# Patient Record
Sex: Female | Born: 1990 | Race: White | Hispanic: No | Marital: Married | Smoking: Never smoker
Health system: Southern US, Community
[De-identification: ages and names within clinical notes are randomized; demographics above are authoritative.]

## PROBLEM LIST (undated history)

## (undated) ENCOUNTER — Inpatient Hospital Stay: Payer: Self-pay

## (undated) DIAGNOSIS — F419 Anxiety disorder, unspecified: Secondary | ICD-10-CM

## (undated) DIAGNOSIS — K769 Liver disease, unspecified: Secondary | ICD-10-CM

## (undated) DIAGNOSIS — F53 Postpartum depression: Secondary | ICD-10-CM

## (undated) DIAGNOSIS — F429 Obsessive-compulsive disorder, unspecified: Secondary | ICD-10-CM

## (undated) DIAGNOSIS — L509 Urticaria, unspecified: Secondary | ICD-10-CM

## (undated) DIAGNOSIS — E559 Vitamin D deficiency, unspecified: Secondary | ICD-10-CM

## (undated) DIAGNOSIS — O24419 Gestational diabetes mellitus in pregnancy, unspecified control: Secondary | ICD-10-CM

## (undated) HISTORY — PX: TONSILLECTOMY AND ADENOIDECTOMY: SHX28

## (undated) HISTORY — PX: ADENOIDECTOMY: SUR15

## (undated) HISTORY — PX: TONSILLECTOMY: SUR1361

## (undated) HISTORY — DX: Vitamin D deficiency, unspecified: E55.9

## (undated) HISTORY — DX: Liver disease, unspecified: K76.9

## (undated) HISTORY — DX: Gestational diabetes mellitus in pregnancy, unspecified control: O24.419

## (undated) HISTORY — DX: Urticaria, unspecified: L50.9

---

## 2013-01-24 ENCOUNTER — Encounter (HOSPITAL_COMMUNITY): Payer: Self-pay | Admitting: Emergency Medicine

## 2013-01-24 ENCOUNTER — Emergency Department (HOSPITAL_COMMUNITY)
Admission: EM | Admit: 2013-01-24 | Discharge: 2013-01-24 | Disposition: A | Discharge status: Home / Self Care | Payer: 59 | Source: Home / Self Care

## 2013-01-24 ENCOUNTER — Emergency Department (INDEPENDENT_AMBULATORY_CARE_PROVIDER_SITE_OTHER): Payer: 59

## 2013-01-24 DIAGNOSIS — K5900 Constipation, unspecified: Secondary | ICD-10-CM

## 2013-01-24 DIAGNOSIS — R141 Gas pain: Secondary | ICD-10-CM

## 2013-01-24 LAB — POCT URINALYSIS DIP (DEVICE)
Hgb urine dipstick: NEGATIVE
Ketones, ur: NEGATIVE mg/dL
Leukocytes, UA: NEGATIVE
Protein, ur: NEGATIVE mg/dL
Specific Gravity, Urine: 1.015 (ref 1.005–1.030)
Urobilinogen, UA: 0.2 mg/dL (ref 0.0–1.0)
pH: 8 (ref 5.0–8.0)

## 2013-01-24 NOTE — ED Provider Notes (Signed)
Medical screening examination/treatment/procedure(s) were performed by non-physician practitioner and as supervising physician I was immediately available for consultation/collaboration.  Leslee Home, M.D.  Reuben Likes, MD 01/24/13 2258

## 2013-01-24 NOTE — ED Provider Notes (Signed)
CSN: [id number]     Arrival date & time 01/24/13  1704 History   First MD Initiated Contact with Patient 01/24/13 1728     Chief Complaint  Patient presents with  . Abdominal Pain    5-7 days. worse with eating.    (Consider location/radiation/quality/duration/timing/severity/associated sxs/prior Treatment) HPI Comments: 22 year old mildly obese female complaining of pain across the upper abdomen this started approximately 5-7 days ago. Today her pain is primarily in the upper right quadrant. It is intermittent but more present than not. It is worse after meals and noting that the pain is worse in the right upper quadrant after meals. She denies having a regular bowel movement on any particular day but did have 3 small bowel movements yesterday. She has nausea and took Pepto-Bismol for that. Denies vomiting, diarrhea, fever or urinary symptoms. Denies headache, upper is for congestion, sore throat, earache, chest pain or shortness of breath.   History reviewed. No pertinent past medical history. History reviewed. No pertinent past surgical history. History reviewed. No pertinent family history. History  Substance Use Topics  . Smoking status: Never Smoker   . Smokeless tobacco: Not on file  . Alcohol Use: Yes   OB History   Grav Para Term Preterm Abortions TAB SAB Ect Mult Living                 Review of Systems  Constitutional: Positive for appetite change. Negative for fever, diaphoresis, activity change and fatigue.  HENT: Negative.   Respiratory: Negative.   Gastrointestinal: Positive for abdominal pain. Negative for diarrhea, blood in stool, abdominal distention and rectal pain.  Genitourinary: Negative.   Musculoskeletal: Negative.   Skin: Negative.   Neurological: Negative.     Allergies  Review of patient's allergies indicates no known allergies.  Home Medications  No current outpatient prescriptions on file. BP 97/62  Pulse 67  Temp(Src) 99 F (37.2 C) (Oral)   Resp 20  SpO2 99%  LMP 12/29/2012 Physical Exam  Nursing note and vitals reviewed. Constitutional: She is oriented to person, place, and time. She appears well-developed and well-nourished. No distress.  HENT:  Mouth/Throat: Oropharynx is clear and moist.  Eyes: Conjunctivae and EOM are normal.  Neck: Neck supple.  Cardiovascular: Normal rate, regular rhythm, normal heart sounds and intact distal pulses.   No murmur heard. Pulmonary/Chest: Effort normal and breath sounds normal. No respiratory distress. She has no wheezes. She has no rales.  Abdominal: Soft. Bowel sounds are normal. She exhibits no distension and no mass. There is no rebound and no guarding.  Mild tenderness across the upper most abdomen and primarily in the right upper quadrant. There is no tenderness below the umbilicus. No tenderness over the suprapubic or pelvic areas.  Musculoskeletal: Normal range of motion. She exhibits no edema and no tenderness.  Lymphadenopathy:    She has no cervical adenopathy.  Neurological: She is alert and oriented to person, place, and time. No cranial nerve deficit. She exhibits normal muscle tone.  Skin: Skin is warm and dry. No erythema.    ED Course  Procedures (including critical care time) Labs Review Labs Reviewed  POCT URINALYSIS DIP (DEVICE)  POCT PREGNANCY, URINE   Imaging Review Dg Abd 1 View  01/24/2013   *RADIOLOGY REPORT*  Clinical Data: Abdominal pain.  ABDOMEN - 1 VIEW  Comparison: None  Findings: Nonobstructive bowel gas pattern.  Stool is present in the cecum and ascending colon with some mottled lucencies projecting over the descending colon.  Visualized skeleton is unremarkable.  IMPRESSION: Stool predominately within the cecum and ascending colon. Nonobstructive bowel gas pattern.   Original Report Authenticated By: Annia Belt, M.D   Results for orders placed during the hospital encounter of 01/24/13  POCT URINALYSIS DIP (DEVICE)      Result Value Range    Glucose, UA NEGATIVE  NEGATIVE mg/dL   Bilirubin Urine NEGATIVE  NEGATIVE   Ketones, ur NEGATIVE  NEGATIVE mg/dL   Specific Gravity, Urine 1.015  1.005 - 1.030   Hgb urine dipstick NEGATIVE  NEGATIVE   pH 8.0  5.0 - 8.0   Protein, ur NEGATIVE  NEGATIVE mg/dL   Urobilinogen, UA 0.2  0.0 - 1.0 mg/dL   Nitrite NEGATIVE  NEGATIVE   Leukocytes, UA NEGATIVE  NEGATIVE  POCT PREGNANCY, URINE      Result Value Range   Preg Test, Ur NEGATIVE  NEGATIVE    MDM   1. Abdominal gas pain   2. Constipation      May use MiraLax adult dosage as needed to clear out bowel. Recommend adding a high fiber diet. If getting any worse pain is more problematic, constant or has an increased intensity, inability to hold down fluids or solid foods or other problems 6 medical attention promptly. Abdominal exam is essentially benign. No peritoneal signs.  Hayden Rasmussen, NP 01/24/13 [phone]

## 2013-01-24 NOTE — ED Notes (Signed)
C/o epigastric and RUQ pain x 5-7 days. States pain is worse with eating. Nausea denies vomiting. States  Bloating and sharp pain radiates across upper abdomen. Pt has tried pepto and tums with no relief.

## 2013-02-01 ENCOUNTER — Telehealth (HOSPITAL_COMMUNITY): Payer: Self-pay | Admitting: *Deleted

## 2013-02-01 NOTE — ED Notes (Signed)
Pt. called and asked if we did a urine pregnancy test while she was here on 9/30.  Pt. verified x 2 and given negative result.  She said thank God. Vassie Moselle 02/01/2013

## 2014-01-03 ENCOUNTER — Emergency Department: Payer: Self-pay | Admitting: Emergency Medicine

## 2014-07-16 ENCOUNTER — Other Ambulatory Visit: Payer: Self-pay | Admitting: Family

## 2014-07-16 DIAGNOSIS — R1011 Right upper quadrant pain: Secondary | ICD-10-CM

## 2014-07-18 ENCOUNTER — Other Ambulatory Visit: Payer: Self-pay | Admitting: Family

## 2014-07-18 ENCOUNTER — Ambulatory Visit
Admission: RE | Admit: 2014-07-18 | Discharge: 2014-07-18 | Disposition: A | Discharge status: Home / Self Care | Payer: 59 | Source: Ambulatory Visit | Attending: Family | Admitting: Family

## 2014-07-18 ENCOUNTER — Ambulatory Visit: Admission: RE | Admit: 2014-07-18 | Payer: 59 | Source: Ambulatory Visit

## 2014-07-18 DIAGNOSIS — K5900 Constipation, unspecified: Secondary | ICD-10-CM

## 2014-07-18 DIAGNOSIS — R1011 Right upper quadrant pain: Secondary | ICD-10-CM

## 2014-07-20 ENCOUNTER — Other Ambulatory Visit: Payer: Self-pay

## 2016-02-11 IMAGING — CR DG ABDOMEN 1V
1 series · 1 of 1 positions shown · non-contrast
Comparison: None.

CLINICAL DATA: Five-day history of right flank pain with pain in
the pelvis and hematuria; previous episodes of constipation ; assess
stool burden

EXAM:
ABDOMEN - 1 VIEW

[view not recorded]
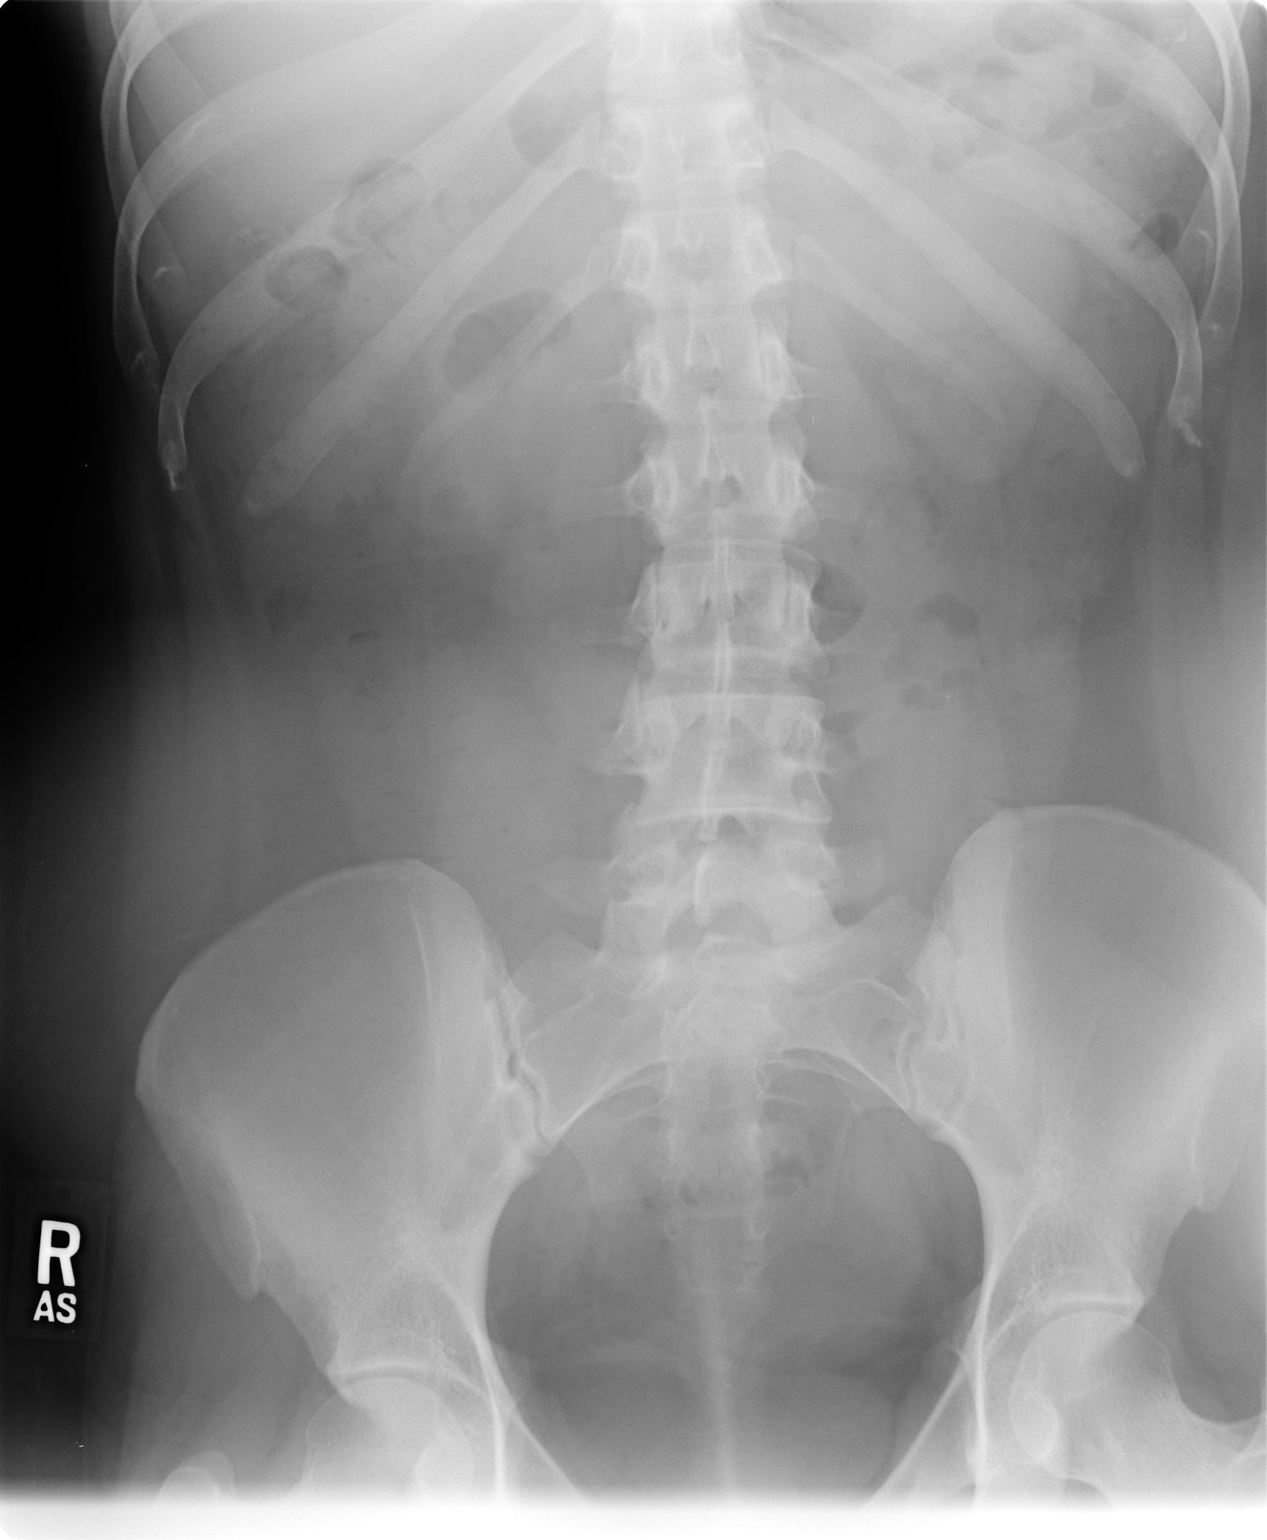

[1 of 1 positions shown; findings below may reference images not displayed]

FINDINGS: The stool burden within the colon is within the limits of normal.
There is no small or large bowel obstruction. There are no abnormal
soft tissue calcifications. The bony structures are unremarkable.
IMPRESSION: The stool burden does not appear excessive. No definite calcified
urinary tract stones are demonstrated.

## 2016-04-24 ENCOUNTER — Ambulatory Visit (INDEPENDENT_AMBULATORY_CARE_PROVIDER_SITE_OTHER): Payer: 59 | Admitting: Certified Nurse Midwife

## 2016-04-24 VITALS — BP 105/60 | HR 91 | Ht 60.0 in | Wt 144.3 lb

## 2016-04-24 DIAGNOSIS — Z202 Contact with and (suspected) exposure to infections with a predominantly sexual mode of transmission: Secondary | ICD-10-CM

## 2016-04-24 DIAGNOSIS — Z3481 Encounter for supervision of other normal pregnancy, first trimester: Secondary | ICD-10-CM

## 2016-04-24 MED ORDER — CITRANATAL B-CALM 20-1 MG & 2 X 25 MG PO MISC: 25.0000 mg | Freq: Every day | ORAL | 3 refills | Status: DC

## 2016-04-24 NOTE — Patient Instructions (Signed)
Minor Illnesses and Medications in Pregnancy  Cold/Flu:  Sudafed for congestion- Robitussin (plain) for cough- Tylenol for discomfort.  Please follow the directions on the label.  Try not to take any more than needed.  OTC Saline nasal spray and air humidifier or cool-mist  Vaporizer to sooth nasal irritation and to loosen congestion.  It is also important to increase intake of non carbonated fluids, especially if you have a fever.  Constipation:  Colace-2 capsules at bedtime; Metamucil- follow directions on label; Senokot- 1 tablet at bedtime.  Any one of these medications can be used.  It is also very important to increase fluids and fruits along with regular exercise.  If problem persists please call the office.  Diarrhea:  Kaopectate as directed on the label.  Eat a bland diet and increase fluids.  Avoid highly seasoned foods.  Headache:  Tylenol 1 or 2 tablets every 3-4 hours as needed  Indigestion:  Maalox, Mylanta, Tums or Rolaids- as directed on label.  Also try to eat small meals and avoid fatty, greasy or spicy foods.  Nausea with or without Vomiting:  Nausea in pregnancy is caused by increased levels of hormones in the body which influence the digestive system and cause irritation when stomach acids accumulate.  Symptoms usually subside after 1st trimester of pregnancy.  Try the following: 1. Keep saltines, graham crackers or dry toast by your bed to eat upon awakening. 2. Don't let your stomach get empty.  Try to eat 5-6 small meals per day instead of 3 large ones. 3. Avoid greasy fatty or highly seasoned foods.  4. Take OTC Unisom 1 tablet at bed time along with OTC Vitamin B6 25-50 mg 3 times per day.    If nausea continues with vomiting and you are unable to keep down food and fluids you may need a prescription medication.  Please notify your provider.   Sore throat:  Chloraseptic spray, throat lozenges and or plain Tylenol.  Vaginal Yeast Infection:  OTC Monistat for 7 days as  directed on label.  If symptoms do not resolve within a week notify provider.  If any of the above problems do not subside with recommended treatment please call the office for further assistance.   Do not take Aspirin, Advil, Motrin or Ibuprofen.  * * OTC= Over the counter How a Baby Grows During Pregnancy Introduction Pregnancy begins when a female's sperm enters a female's egg (fertilization). This happens in one of the tubes (fallopian tubes) that connect the ovaries to the womb (uterus). The fertilized egg is called an embryo until it reaches 10 weeks. From 10 weeks until birth, it is called a fetus. The fertilized egg moves down the fallopian tube to the uterus. Then it implants into the lining of the uterus and begins to grow. The developing fetus receives oxygen and nutrients through the pregnant woman's bloodstream and the tissues that grow (placenta) to support the fetus. The placenta is the life support system for the fetus. It provides nutrition and removes waste. Learning as much as you can about your pregnancy and how your baby is developing can help you enjoy the experience. It can also make you aware of when there might be a problem and when to ask questions. How long does a typical pregnancy last? A pregnancy usually lasts 280 days, or about 40 weeks. Pregnancy is divided into three trimesters:  First trimester: 0-13 weeks.  Second trimester: 14-27 weeks.  Third trimester: 28-40 weeks. The day when your baby  is considered ready to be born (full term) is your estimated date of delivery. How does my baby develop month by month? First month  The fertilized egg attaches to the inside of the uterus.  Some cells will form the placenta. Others will form the fetus.  The arms, legs, brain, spinal cord, lungs, and heart begin to develop.  At the end of the first month, the heart begins to beat. Second month  The bones, inner ear, eyelids, hands, and feet form.  The genitals  develop.  By the end of 8 weeks, all major organs are developing. Third month  All of the internal organs are forming.  Teeth develop below the gums.  Bones and muscles begin to grow. The spine can flex.  The skin is transparent.  Fingernails and toenails begin to form.  Arms and legs continue to grow longer, and hands and feet develop.  The fetus is about 3 in (7.6 cm) long. Fourth month  The placenta is completely formed.  The external sex organs, neck, outer ear, eyebrows, eyelids, and fingernails are formed.  The fetus can hear, swallow, and move its arms and legs.  The kidneys begin to produce urine.  The skin is covered with a white waxy coating (vernix) and very fine hair (lanugo). Fifth month  The fetus moves around more and can be felt for the first time (quickening).  The fetus starts to sleep and wake up and may begin to suck its finger.  The nails grow to the end of the fingers.  The organ in the digestive system that makes bile (gallbladder) functions and helps to digest the nutrients.  If your baby is a girl, eggs are present in her ovaries. If your baby is a boy, testicles start to move down into his scrotum. Sixth month  The lungs are formed, but the fetus is not yet able to breathe.  The eyes open. The brain continues to develop.  Your baby has fingerprints and toe prints. Your baby's hair grows thicker.  At the end of the second trimester, the fetus is about 9 in (22.9 cm) long. Seventh month  The fetus kicks and stretches.  The eyes are developed enough to sense changes in light.  The hands can make a grasping motion.  The fetus responds to sound. Eighth month  All organs and body systems are fully developed and functioning.  Bones harden and taste buds develop. The fetus may hiccup.  Certain areas of the brain are still developing. The skull remains soft. Ninth month  The fetus gains about  lb (0.23 kg) each week.  The lungs  are fully developed.  Patterns of sleep develop.  The fetus's head typically moves into a head-down position (vertex) in the uterus to prepare for birth. If the buttocks move into a vertex position instead, the baby is breech.  The fetus weighs 6-9 lbs (2.72-4.08 kg) and is 19-20 in (48.26-50.8 cm) long. What can I do to have a healthy pregnancy and help my baby develop?  Eating and Drinking  Eat a healthy diet.  Talk with your health care provider to make sure that you are getting the nutrients that you and your baby need.  Visit www.BuildDNA.es to learn about creating a healthy diet.  Gain a healthy amount of weight during pregnancy as advised by your health care provider. This is usually 25-35 pounds. You may need to:  Gain more if you were underweight before getting pregnant or if you are pregnant  with more than one baby.  Gain less if you were overweight or obese when you got pregnant. Medicines and Vitamins  Take prenatal vitamins as directed by your health care provider. These include vitamins such as folic acid, iron, calcium, and vitamin D. They are important for healthy development.  Take medicines only as directed by your health care provider. Read labels and ask a pharmacist or your health care provider whether over-the-counter medicines, supplements, and prescription drugs are safe to take during pregnancy. Activities  Be physically active as advised by your health care provider. Ask your health care provider to recommend activities that are safe for you to do, such as walking or swimming.  Do not participate in strenuous or extreme sports.  Lifestyle  Do not drink alcohol.  Do not use any tobacco products, including cigarettes, chewing tobacco, or electronic cigarettes. If you need help quitting, ask your health care provider.  Do not use illegal drugs. Safety  Avoid exposure to mercury, lead, or other heavy metals. Ask your health care provider about  common sources of these heavy metals.  Avoid listeria infection during pregnancy. Follow these precautions:  Do not eat soft cheeses or deli meats.  Do not eat hot dogs unless they have been warmed up to the point of steaming, such as in the microwave oven.  Do not drink unpasteurized milk.  Avoid toxoplasmosis infection during pregnancy. Follow these precautions:  Do not change your cat's litter box, if you have a cat. Ask someone else to do this for you.  Wear gardening gloves while working in the yard. General Instructions  Keep all follow-up visits as directed by your health care provider. This is important. This includes prenatal care and screening tests.  Manage any chronic health conditions. Work closely with your health care provider to keep conditions, such as diabetes, under control. How do I know if my baby is developing well? At each prenatal visit, your health care provider will do several different tests to check on your health and keep track of your baby's development. These include:  Fundal height.  Your health care provider will measure your growing belly from top to bottom using a tape measure.  Your health care provider will also feel your belly to determine your baby's position.  Heartbeat.  An ultrasound in the first trimester can confirm pregnancy and show a heartbeat, depending on how far along you are.  Your health care provider will check your baby's heart rate at every prenatal visit.  As you get closer to your delivery date, you may have regular fetal heart rate monitoring to make sure that your baby is not in distress.  Second trimester ultrasound.  This ultrasound checks your baby's development. It also indicates your baby's gender. What should I do if I have concerns about my baby's development? Always talk with your health care provider about any concerns that you may have. This information is not intended to replace advice given to you by your  health care provider. Make sure you discuss any questions you have with your health care provider. Document Released: 09/30/2007 Document Revised: 09/19/2015 Document Reviewed: 09/20/2013  2017 Else[employer] First Trimester of Pregnancy The first trimester of pregnancy is from week 1 until the end of week 12 (months 1 through 3). A week after a sperm fertilizes an egg, the egg will implant on the wall of the uterus. This embryo will begin to develop into a baby. Genes from you and your partner are forming the  baby. The female genes determine whether the baby is a boy or a girl. At 6-8 weeks, the eyes and face are formed, and the heartbeat can be seen on ultrasound. At the end of 12 weeks, all the baby's organs are formed.  Now that you are pregnant, you will want to do everything you can to have a healthy baby. Two of the most important things are to get good prenatal care and to follow your health care provider's instructions. Prenatal care is all the medical care you receive before the baby's birth. This care will help prevent, find, and treat any problems during the pregnancy and childbirth. BODY CHANGES Your body goes through many changes during pregnancy. The changes vary from woman to woman.   You may gain or lose a couple of pounds at first.  You may feel sick to your stomach (nauseous) and throw up (vomit). If the vomiting is uncontrollable, call your health care provider.  You may tire easily.  You may develop headaches that can be relieved by medicines approved by your health care provider.  You may urinate more often. Painful urination may mean you have a bladder infection.  You may develop heartburn as a result of your pregnancy.  You may develop constipation because certain hormones are causing the muscles that push waste through your intestines to slow down.  You may develop hemorrhoids or swollen, bulging veins (varicose veins).  Your breasts may begin to grow larger and become  tender. Your nipples may stick out more, and the tissue that surrounds them (areola) may become darker.  Your gums may bleed and may be sensitive to brushing and flossing.  Dark spots or blotches (chloasma, mask of pregnancy) may develop on your face. This will likely fade after the baby is born.  Your menstrual periods will stop.  You may have a loss of appetite.  You may develop cravings for certain kinds of food.  You may have changes in your emotions from day to day, such as being excited to be pregnant or being concerned that something may go wrong with the pregnancy and baby.  You may have more vivid and strange dreams.  You may have changes in your hair. These can include thickening of your hair, rapid growth, and changes in texture. Some women also have hair loss during or after pregnancy, or hair that feels dry or thin. Your hair will most likely return to normal after your baby is born. WHAT TO EXPECT AT YOUR PRENATAL VISITS During a routine prenatal visit:  You will be weighed to make sure you and the baby are growing normally.  Your blood pressure will be taken.  Your abdomen will be measured to track your baby's growth.  The fetal heartbeat will be listened to starting around week 10 or 12 of your pregnancy.  Test results from any previous visits will be discussed. Your health care provider may ask you:  How you are feeling.  If you are feeling the baby move.  If you have had any abnormal symptoms, such as leaking fluid, bleeding, severe headaches, or abdominal cramping.  If you are using any tobacco products, including cigarettes, chewing tobacco, and electronic cigarettes.  If you have any questions. Other tests that may be performed during your first trimester include:  Blood tests to find your blood type and to check for the presence of any previous infections. They will also be used to check for low iron levels (anemia) and Rh antibodies. Later in  the  pregnancy, blood tests for diabetes will be done along with other tests if problems develop.  Urine tests to check for infections, diabetes, or protein in the urine.  An ultrasound to confirm the proper growth and development of the baby.  An amniocentesis to check for possible genetic problems.  Fetal screens for spina bifida and Down syndrome.  You may need other tests to make sure you and the baby are doing well.  HIV (human immunodeficiency virus) testing. Routine prenatal testing includes screening for HIV, unless you choose not to have this test. HOME CARE INSTRUCTIONS  Medicines   Follow your health care provider's instructions regarding medicine use. Specific medicines may be either safe or unsafe to take during pregnancy.  Take your prenatal vitamins as directed.  If you develop constipation, try taking a stool softener if your health care provider approves. Diet   Eat regular, well-balanced meals. Choose a variety of foods, such as meat or vegetable-based protein, fish, milk and low-fat dairy products, vegetables, fruits, and whole grain breads and cereals. Your health care provider will help you determine the amount of weight gain that is right for you.  Avoid raw meat and uncooked cheese. These carry germs that can cause birth defects in the baby.  Eating four or five small meals rather than three large meals a day may help relieve nausea and vomiting. If you start to feel nauseous, eating a few soda crackers can be helpful. Drinking liquids between meals instead of during meals also seems to help nausea and vomiting.  If you develop constipation, eat more high-fiber foods, such as fresh vegetables or fruit and whole grains. Drink enough fluids to keep your urine clear or pale yellow. Activity and Exercise   Exercise only as directed by your health care provider. Exercising will help you:  Control your weight.  Stay in shape.  Be prepared for labor and  delivery.  Experiencing pain or cramping in the lower abdomen or low back is a good sign that you should stop exercising. Check with your health care provider before continuing normal exercises.  Try to avoid standing for long periods of time. Move your legs often if you must stand in one place for a long time.  Avoid heavy lifting.  Wear low-heeled shoes, and practice good posture.  You may continue to have sex unless your health care provider directs you otherwise. Relief of Pain or Discomfort   Wear a good support bra for breast tenderness.   Take warm sitz baths to soothe any pain or discomfort caused by hemorrhoids. Use hemorrhoid cream if your health care provider approves.   Rest with your legs elevated if you have leg cramps or low back pain.  If you develop varicose veins in your legs, wear support hose. Elevate your feet for 15 minutes, 3-4 times a day. Limit salt in your diet. Prenatal Care   Schedule your prenatal visits by the twelfth week of pregnancy. They are usually scheduled monthly at first, then more often in the last 2 months before delivery.  Write down your questions. Take them to your prenatal visits.  Keep all your prenatal visits as directed by your health care provider. Safety   Wear your seat belt at all times when driving.  Make a list of emergency phone numbers, including numbers for family, friends, the hospital, and police and fire departments. General Tips   Ask your health care provider for a referral to a local prenatal education class. Begin  classes no later than at the beginning of month 6 of your pregnancy.  Ask for help if you have counseling or nutritional needs during pregnancy. Your health care provider can offer advice or refer you to specialists for help with various needs.  Do not use hot tubs, steam rooms, or saunas.  Do not douche or use tampons or scented sanitary pads.  Do not cross your legs for long periods of  time.  Avoid cat litter boxes and soil used by cats. These carry germs that can cause birth defects in the baby and possibly loss of the fetus by miscarriage or stillbirth.  Avoid all smoking, herbs, alcohol, and medicines not prescribed by your health care provider. Chemicals in these affect the formation and growth of the baby.  Do not use any tobacco products, including cigarettes, chewing tobacco, and electronic cigarettes. If you need help quitting, ask your health care provider. You may receive counseling support and other resources to help you quit.  Schedule a dentist appointment. At home, brush your teeth with a soft toothbrush and be gentle when you floss. SEEK MEDICAL CARE IF:   You have dizziness.  You have mild pelvic cramps, pelvic pressure, or nagging pain in the abdominal area.  You have persistent nausea, vomiting, or diarrhea.  You have a bad smelling vaginal discharge.  You have pain with urination.  You notice increased swelling in your face, hands, legs, or ankles. SEEK IMMEDIATE MEDICAL CARE IF:   You have a fever.  You are leaking fluid from your vagina.  You have spotting or bleeding from your vagina.  You have severe abdominal cramping or pain.  You have rapid weight gain or loss.  You vomit blood or material that looks like coffee grounds.  You are exposed to [place] measles and have never had them.  You are exposed to fifth disease or chickenpox.  You develop a severe headache.  You have shortness of breath.  You have any kind of trauma, such as from a fall or a car accident. This information is not intended to replace advice given to you by your health care provider. Make sure you discuss any questions you have with your health care provider. Document Released: 04/07/2001 Document Revised: 05/04/2014 Document Reviewed: 02/21/2013 Elsevier Interactive Patient Education  2017 [place]. Commonly Asked Questions During Pregnancy  Cats: A  parasite can be excreted in cat feces.  To avoid exposure you need to have another person empty the little box.  If you must empty the litter box you will need to wear gloves.  Wash your hands after handling your cat.  This parasite can also be found in raw or undercooked meat so this should also be avoided.  Colds, Sore Throats, Flu: Please check your medication sheet to see what you can take for symptoms.  If your symptoms are unrelieved by these medications please call the office.  Dental Work: Most any dental work [employer], [employer] recommends is permitted.  X-rays should only be taken during the first trimester if absolutely necessary.  Your abdomen should be shielded with a lead apron during all x-rays.  Please notify your provider prior to receiving any x-rays.  Novocaine is fine; gas is not recommended.  If your dentist requires a note from [place] prior to dental work please call the office and we will provide one for you.  Exercise: Exercise is an important part of staying healthy during your pregnancy.  You may continue most exercises you were accustomed  to prior to pregnancy.  Later in your pregnancy you will most likely notice you have difficulty with activities requiring balance like riding a bicycle.  It is important that you listen to your body and avoid activities that put you at a higher risk of falling.  Adequate rest and staying well hydrated are a must!  If you have questions about the safety of specific activities ask your provider.    Exposure to Children with illness: Try to avoid obvious exposure; report any symptoms to [place] when noted,  If you have chicken pos, red measles or mumps, you should be immune to these diseases.   Please do not take any vaccines while pregnant unless you have checked with your OB provider.  Fetal Movement: After 28 weeks we recommend you do "kick counts" twice daily.  Lie or sit down in a calm quiet environment and count your baby movements "kicks".  You should feel  your baby at least 10 times per hour.  If you have not felt 10 kicks within the first hour get up, walk around and have something sweet to eat or drink then repeat for an additional hour.  If count remains less than 10 per hour notify your provider.  Fumigating: Follow your pest control agent's advice as to how long to stay out of your home.  Ventilate the area well before re-entering.  Hemorrhoids:   Most over-the-counter preparations can be used during pregnancy.  Check your medication to see what is safe to use.  It is important to use a stool softener or fiber in your diet and to drink lots of liquids.  If hemorrhoids seem to be getting worse please call the office.   Hot Tubs:  Hot tubs Jacuzzis and saunas are not recommended while pregnant.  These increase your internal body temperature and should be avoided.  Intercourse:  Sexual intercourse is safe during pregnancy as long as you are comfortable, unless otherwise advised by your provider.  Spotting may occur after intercourse; report any bright red bleeding that is heavier than spotting.  Labor:  If you know that you are in labor, please go to the hospital.  If you are unsure, please call the office and let us help you decide what to do.  Lifting, straining, etc:  If your job requires heavy lifting or straining please check with your provider for any limitations.  Generally, you should not lift items heavier than that you can lift simply with your hands and arms (no back muscles)  Painting:  Paint fumes do not harm your pregnancy, but may make you ill and should be avoided if possible.  Latex or water based paints have less odor than oils.  Use adequate ventilation while painting.  Permanents & Hair Color:  Chemicals in hair dyes are not recommended as they cause increase hair dryness which can increase hair loss during pregnancy.  " Highlighting" and permanents are allowed.  Dye may be absorbed differently and permanents may not hold as well  during pregnancy.  Sunbathing:  Use a sunscreen, as skin burns easily during pregnancy.  Drink plenty of fluids; avoid over heating.  Tanning Beds:  Because their possible side effects are still unknown, tanning beds are not recommended.  Ultrasound Scans:  Routine ultrasounds are performed at approximately 20 weeks.  You will be able to see your baby's general anatomy an if you would like to know the gender this can usually be determined as well.  If it is questionable when  you conceived you may also receive an ultrasound early in your pregnancy for dating purposes.  Otherwise ultrasound exams are not routinely performed unless there is a medical necessity.  Although you can request a scan we ask that you pay for it when conducted because insurance does not cover " patient request" scans.  Work: If your pregnancy proceeds without complications you may work until your due date, unless your physician or employer advises otherwise.  Round Ligament Pain/Pelvic Discomfort:  Sharp, shooting pains not associated with bleeding are fairly common, usually occurring in the second trimester of pregnancy.  They tend to be worse when standing up or when you remain standing for long periods of time.  These are the result of pressure of certain pelvic ligaments called "round ligaments".  Rest, Tylenol and heat seem to be the most effective relief.  As the womb and fetus grow, they rise out of the pelvis and the discomfort improves.  Please notify the office if your pain seems different than that described.  It may represent a more serious condition.

## 2016-04-24 NOTE — Progress Notes (Signed)
Alexis Snow presents for NOB nurse interview visit. Pregnancy confirmation done _at __[facility] (pos SPT)_. LMP- 03/09/2016(certain)- EDD 99991111  GA- 6 4/7     G2- .  P0010- . Pregnancy education material explained and given. _0__ cats in the home. NOB labs ordered.   Drug screen and HIV ordered.  PNV encouraged. Mild nuasea. No vomiting. No meds needed at this time. Pt will contact office if Diclegis rx needed. Citra natal bcalm erx.  Genetic screening  to discuss with provider. Pt. To follow up with [name] in _4_ weeks for NOB physical.  All questions answered.

## 2016-04-25 LAB — URINALYSIS, ROUTINE W REFLEX MICROSCOPIC
Bilirubin, UA: NEGATIVE
Glucose, UA: NEGATIVE
KETONES UA: NEGATIVE
Leukocytes, UA: NEGATIVE
NITRITE UA: NEGATIVE
PH UA: 7.5 (ref 5.0–7.5)
Protein, UA: NEGATIVE
RBC UA: NEGATIVE
Specific Gravity, UA: 1.021 (ref 1.005–1.030)
Urobilinogen, Ur: 0.2 mg/dL (ref 0.2–1.0)

## 2016-04-25 LAB — CBC WITH DIFFERENTIAL/PLATELET
Basophils Absolute: 0 10*3/uL (ref 0.0–0.2)
Basos: 0 %
EOS (ABSOLUTE): 0.1 10*3/uL (ref 0.0–0.4)
Eos: 1 %
Hematocrit: 37.4 % (ref 34.0–46.6)
Hemoglobin: 12.6 g/dL (ref 11.1–15.9)
IMMATURE GRANS (ABS): 0 10*3/uL (ref 0.0–0.1)
IMMATURE GRANULOCYTES: 0 %
Lymphocytes Absolute: 2.7 10*3/uL (ref 0.7–3.1)
Lymphs: 33 %
MCH: 32.7 pg (ref 26.6–33.0)
MCHC: 33.7 g/dL (ref 31.5–35.7)
MCV: 97 fL (ref 79–97)
Monocytes Absolute: 0.7 10*3/uL (ref 0.1–0.9)
Monocytes: 8 %
NEUTROS PCT: 58 %
Neutrophils Absolute: 4.8 10*3/uL (ref 1.4–7.0)
Platelets: 320 10*3/uL (ref 150–379)
RBC: 3.85 x10E6/uL (ref 3.77–5.28)
RDW: 12.7 % (ref 12.3–15.4)
WBC: 8.4 10*3/uL (ref 3.4–10.8)

## 2016-04-25 LAB — NICOTINE SCREEN, URINE: COTININE UR QL SCN: NEGATIVE ng/mL

## 2016-04-25 LAB — VARICELLA ZOSTER ANTIBODY, IGG: Varicella zoster IgG: 1185 index (ref >165)

## 2016-04-25 LAB — MONITOR DRUG PROFILE 14(MW)
AMPHETAMINE SCREEN URINE: NEGATIVE ng/mL
BARBITURATE SCREEN URINE: NEGATIVE ng/mL
BENZODIAZEPINE SCREEN, URINE: NEGATIVE ng/mL
Buprenorphine, Urine: NEGATIVE ng/mL
CANNABINOIDS UR QL SCN: NEGATIVE ng/mL
Cocaine (Metab) Scrn, Ur: NEGATIVE ng/mL
Creatinine(Crt), U: 124.4 mg/dL (ref 20.0–300.0)
Fentanyl, Urine: NEGATIVE pg/mL
Meperidine Screen, Urine: NEGATIVE ng/mL
Methadone Screen, Urine: NEGATIVE ng/mL
OXYCODONE+OXYMORPHONE UR QL SCN: NEGATIVE ng/mL
Opiate Scrn, Ur: NEGATIVE ng/mL
PHENCYCLIDINE QUANTITATIVE URINE: NEGATIVE ng/mL
PROPOXYPHENE SCREEN URINE: NEGATIVE ng/mL
Ph of Urine: 7.2 (ref 4.5–8.9)
SPECIFIC GRAVITY: 1.022
Tramadol Screen, Urine: NEGATIVE ng/mL

## 2016-04-25 LAB — RUBELLA SCREEN: Rubella Antibodies, IGG: <0.9 index — ABNORMAL LOW (ref >0.99)

## 2016-04-25 LAB — RPR: RPR: NONREACTIVE

## 2016-04-25 LAB — ABO AND RH: RH TYPE: POSITIVE

## 2016-04-25 LAB — HEPATITIS B SURFACE ANTIGEN: Hepatitis B Surface Ag: NEGATIVE

## 2016-04-25 LAB — ANTIBODY SCREEN: ANTIBODY SCREEN: NEGATIVE

## 2016-04-25 LAB — HIV ANTIBODY (ROUTINE TESTING W REFLEX): HIV Screen 4th Generation wRfx: NONREACTIVE

## 2016-04-26 LAB — GC/CHLAMYDIA PROBE AMP
CHLAMYDIA, DNA PROBE: NEGATIVE
Neisseria gonorrhoeae by PCR: NEGATIVE

## 2016-04-26 LAB — URINE CULTURE, OB REFLEX

## 2016-04-26 LAB — CULTURE, OB URINE

## 2016-05-01 ENCOUNTER — Other Ambulatory Visit: Payer: Self-pay | Admitting: Certified Nurse Midwife

## 2016-05-01 DIAGNOSIS — Z369 Encounter for antenatal screening, unspecified: Secondary | ICD-10-CM

## 2016-05-08 ENCOUNTER — Ambulatory Visit (INDEPENDENT_AMBULATORY_CARE_PROVIDER_SITE_OTHER): Payer: 59

## 2016-05-08 DIAGNOSIS — Z369 Encounter for antenatal screening, unspecified: Secondary | ICD-10-CM

## 2016-05-29 ENCOUNTER — Encounter: Payer: Self-pay | Admitting: Certified Nurse Midwife

## 2016-05-29 ENCOUNTER — Ambulatory Visit (INDEPENDENT_AMBULATORY_CARE_PROVIDER_SITE_OTHER): Payer: 59 | Admitting: Certified Nurse Midwife

## 2016-05-29 VITALS — BP 118/62 | HR 96 | Wt 142.6 lb

## 2016-05-29 DIAGNOSIS — Z3491 Encounter for supervision of normal pregnancy, unspecified, first trimester: Secondary | ICD-10-CM

## 2016-05-29 NOTE — Progress Notes (Signed)
NEW OB HISTORY AND PHYSICAL  SUBJECTIVE:       Alexis Snow is a 26 y.o. G48P0010 female, Patient's last menstrual period was 03/09/2016 (exact date)., Estimated Date of Delivery: 12/14/16, [redacted]w[redacted]d, presents today for establishment of Prenatal Care.  She has no unusual complaints and complains of occasional headaches without visual changes.   Denies difficulty breathing or respiratory distress, chest pain, abdominal pain, vaginal bleeding and leg pain or swelling.    Gynecologic History  Patient's last menstrual period was 03/09/2016 (exact date).  Last Pap: 2017. Results were: normal  Obstetric History OB History  Gravida Para Term Preterm AB Living  2       1    SAB TAB Ectopic Multiple Live Births    1          # Outcome Date GA Lbr Len/2nd Weight Sex Delivery Anes PTL Lv  2 Current           1 TAB 2013              Past Medical History:  Diagnosis Date  . Vitamin D deficiency     Past Surgical History:  Procedure Laterality Date  . TONSILLECTOMY AND ADENOIDECTOMY      Current Outpatient Prescriptions on File Prior to Visit  Medication Sig Dispense Refill  . Prenat w/o A FeCbnFeGlu-FA &B6 (CITRANATAL B-CALM) 20-1 & 25 (2) MG MISC Take 25 mg by mouth daily. 90 each 3  . Prenatal Vit-Fe Fumarate-FA (PRENATAL MULTIVITAMIN) TABS tablet Take 1 tablet by mouth daily at 12 noon.     No current facility-administered medications on file prior to visit.     No Known Allergies  Social History   Social History  . Marital status: Single    Spouse name: N/A  . Number of children: N/A  . Years of education: N/A   Occupational History  . Not on file.   Social History Main Topics  . Smoking status: Never Smoker  . Smokeless tobacco: Never Used  . Alcohol use No     Comment: wine - 3 x a week  . Drug use: No  . Sexual activity: Yes    Birth control/ protection: None   Other Topics Concern  . Not on file   Social History Narrative  . No narrative on file     Family History  Problem Relation Age of Onset  . Cancer Maternal Grandmother   . Heart disease Maternal Grandmother   . Cancer Maternal Grandfather   . Heart disease Paternal Grandfather   . Breast cancer Neg Hx   . Ovarian cancer Neg Hx   . Colon cancer Neg Hx     The following portions of the patient's history were reviewed and updated as appropriate: allergies, current medications, past OB history, past medical history, past surgical history, past family history, past social history, and problem list.    OBJECTIVE: Initial Physical Exam (New OB)  GENERAL APPEARANCE: alert, well appearing, in no apparent distress  HEAD: normocephalic, atraumatic  MOUTH: mucous membranes moist, pharynx normal without lesions  THYROID: no thyromegaly or masses present  BREASTS: no masses noted, no significant tenderness, no palpable axillary nodes, no skin changes  LUNGS: clear to auscultation, no wheezes, rales or rhonchi, symmetric air entry  HEART: regular rate and rhythm, no murmurs  ABDOMEN: soft, nontender, nondistended, no abnormal masses, no epigastric pain, fundus soft, nontender 11 weeks size and FHT present  EXTREMITIES: no redness or tenderness in the calves or thighs,  no edema  SKIN: normal coloration and turgor, no rashes  LYMPH NODES: no adenopathy palpable  NEUROLOGIC: alert, oriented, normal speech, no focal findings or movement disorder noted  PELVIC EXAM deferred at patient request  ASSESSMENT: Normal pregnancy  PLAN: Prenatal care Reviewed red flag symptoms and when to call Discussed safe pregnancy medications, exercise in pregnancy, and pregnancy weight gain expectations Declines genetic screening RTC x 4 weeks for ROB See orders   Diona Fanti, CNM

## 2016-05-29 NOTE — Patient Instructions (Addendum)
Second Trimester of Pregnancy The second trimester is from week 13 through week 28, month 4 through 6. This is often the time in pregnancy that you feel your best. Often times, morning sickness has lessened or quit. You may have more energy, and you may get hungry more often. Your unborn baby (fetus) is growing rapidly. At the end of the sixth month, he or she is about 9 inches long and weighs about 1 pounds. You will likely feel the baby move (quickening) between 18 and 20 weeks of pregnancy. Follow these instructions at home:  Avoid all smoking, herbs, and alcohol. Avoid drugs not approved by your doctor.  Do not use any tobacco products, including cigarettes, chewing tobacco, and electronic cigarettes. If you need help quitting, ask your doctor. You may get counseling or other support to help you quit.  Only take medicine as told by your doctor. Some medicines are safe and some are not during pregnancy.  Exercise only as told by your doctor. Stop exercising if you start having cramps.  Eat regular, healthy meals.  Wear a good support bra if your breasts are tender.  Do not use hot tubs, steam rooms, or saunas.  Wear your seat belt when driving.  Avoid raw meat, uncooked cheese, and liter boxes and soil used by cats.  Take your prenatal vitamins.  Take 1500-2000 milligrams of calcium daily starting at the 20th week of pregnancy until you deliver your baby.  Try taking medicine that helps you poop (stool softener) as needed, and if your doctor approves. Eat more fiber by eating fresh fruit, vegetables, and whole grains. Drink enough fluids to keep your pee (urine) clear or pale yellow.  Take warm water baths (sitz baths) to soothe pain or discomfort caused by hemorrhoids. Use hemorrhoid cream if your doctor approves.  If you have puffy, bulging veins (varicose veins), wear support hose. Raise (elevate) your feet for 15 minutes, 3-4 times a day. Limit salt in your diet.  Avoid heavy  lifting, wear low heals, and sit up straight.  Rest with your legs raised if you have leg cramps or low back pain.  Visit your dentist if you have not gone during your pregnancy. Use a soft toothbrush to brush your teeth. Be gentle when you floss.  You can have sex (intercourse) unless your doctor tells you not to.  Go to your doctor visits. Get help if:  You feel dizzy.  You have mild cramps or pressure in your lower belly (abdomen).  You have a nagging pain in your belly area.  You continue to feel sick to your stomach (nauseous), throw up (vomit), or have watery poop (diarrhea).  You have bad smelling fluid coming from your vagina.  You have pain with peeing (urination). Get help right away if:  You have a fever.  You are leaking fluid from your vagina.  You have spotting or bleeding from your vagina.  You have severe belly cramping or pain.  You lose or gain weight rapidly.  You have trouble catching your breath and have chest pain.  You notice sudden or extreme puffiness (swelling) of your face, hands, ankles, feet, or legs.  You have not felt the baby move in over an hour.  You have severe headaches that do not go away with medicine.  You have vision changes. This information is not intended to replace advice given to you by your health care provider. Make sure you discuss any questions you have with your health care   provider. Document Released: 07/08/2009 Document Revised: 09/19/2015 Document Reviewed: 06/14/2012 Elsevier Interactive Patient Education  2017 [employer].  Exercise During Pregnancy For people of all ages, exercise is an important part of being healthy. Exercise improves heart and lung function and helps to maintain strength, flexibility, and a healthy body weight. Exercise also boosts energy levels and elevates mood. For most women, maintaining an exercise routine throughout pregnancy is recommended. It is only on rare occasions and with  certain medical conditions or pregnancy complications that women may be asked to limit or avoid exercise during pregnancy. What are some other benefits to exercising during pregnancy? Along with maintaining strength and flexibility, exercising throughout pregnancy can help to:  Keep strength in muscles that are very important during labor and childbirth.  Decrease low back pain during pregnancy.  Decrease the risk of developing gestational diabetes mellitus (GDM).  Improve blood sugar (glucose) control for women who have GDM.  Decrease the risk of developing preeclampsia. This is a serious condition that causes high blood pressure along with other symptoms, such as swelling and headaches.  Decrease the risk of cesarean delivery.  Speed up the recovery after giving birth. How often should I exercise? Unless your health care provider gives you different instructions, you should try to exercise on most days or all days of the week. In general, try to exercise with moderate intensity for about 150 minutes per week. This can be spread out across several days, such as exercising for 30 minutes per day on 5 days of each week. You can tell that you are exercising at a moderate intensity if you have a higher heart rate and faster breathing, but you are still able to hold a conversation. What types of moderate-intensity exercise are recommended during pregnancy? There are many types of exercise that are safe for you to do during pregnancy. Unless your health care provider gives you different instructions, do a variety of exercises that safely increase your heart and breathing (cardiopulmonary) rates and help you to build and maintain muscle strength (strength training). You should always be able to talk in full sentences while exercising during pregnancy. Some examples of exercising that is safe to do during pregnancy include:  Brisk walking or hiking.  Swimming.  Water aerobics.  Riding a  stationary bike.  Strength training.  Modified yoga or Pilates. Tell your instructor that you are pregnant. Avoid overstretching and avoid lying on your back for long periods of time.  Running or jogging. Only choose this type of exercise if:  You ran or jogged regularly before your pregnancy.  You can run or jog and still talk in complete sentences. What types of exercise should I not do during pregnancy? Depending on your level of fitness and whether you exercised regularly before your pregnancy, you may be advised to limit vigorous-intensity exercise during your pregnancy. You can tell that you are exercising at a vigorous intensity if you are breathing much harder and faster and cannot hold a conversation while exercising. Some examples of exercising that you should avoid during pregnancy include:  Contact sports.  Activities that place you at risk for falling on or being hit in the belly, such as downhill skiing, water skiing, surfing, rock climbing, cycling, gymnastics, and horseback riding.  Scuba diving.  Sky diving.  Yoga or Pilates in a room that is heated to extreme temperatures ("hot yoga" or "hot Pilates").  Jogging or running, unless you ran or jogged regularly before your pregnancy. While jogging or  running, you should always be able to talk in full sentences. Do not run or jog so vigorously that you are unable to have a conversation.  If you are not used to exercising at elevation (more than 6,000 feet above sea level), do not do so during your pregnancy. When should I avoid exercising during pregnancy? Certain medical conditions can make it unsafe to exercise during pregnancy, or they may increase your risk of miscarriage or early labor and birth. Some of these conditions include:  Some types of heart disease.  Some types of lung disease.  Placenta previa. This is when the placenta partially or completely covers the opening of the uterus (cervix).  Frequent  bleeding from the vagina during your pregnancy.  Incompetent cervix. This is when your cervix does not remain as tightly closed during pregnancy as it should.  Premature labor.  Ruptured membranes. This is when the protective sac (amniotic sac) opens up and amniotic fluid leaks from your vagina.  Severely low blood count (anemia).  Preeclampsia or pregnancy-caused high blood pressure.  Carrying more than one baby (multiple gestation) and having an additional risk of early labor.  Poorly controlled diabetes.  Being severely underweight or severely overweight.  Intrauterine growth restriction. This is when your baby's growth and development during pregnancy are slower than expected.  Other medical conditions. Ask your health care provider if any apply to you. What else should I know about exercising during pregnancy? You should take these precautions while exercising during pregnancy:  Avoid overheating.  Wear loose-fitting, breathable clothes.  Do not exercise in very high temperatures.  Avoid dehydration. Drink enough water before, during, and after exercise to keep your urine clear or pale yellow.  Avoid overstretching. Because of hormone changes during pregnancy, it is easy to overstretch muscles, tendons, and ligaments during pregnancy.  Start slowly and ask your health care provider to recommend types of exercise that are safe for you, if exercising regularly is new for you. Pregnancy is not a time for exercising to lose weight. When should I seek medical care? You should stop exercising and call your health care provider if you have any unusual symptoms, such as:  Mild uterine contractions or abdominal cramping.  Dizziness that does not improve with rest. When should I seek immediate medical care? You should stop exercising and call your local emergency services (911 in the [place] if you have any unusual symptoms, such as:  Sudden, severe pain in your low back or your  belly.  Uterine contractions or abdominal cramping that do not improve with rest.  Chest pain.  Bleeding or fluid leaking from your vagina.  Shortness of breath. This information is not intended to replace advice given to you by your health care provider. Make sure you discuss any questions you have with your health care provider. Document Released: 04/13/2005 Document Revised: 09/11/2015 Document Reviewed: 06/21/2014 Elsevier Interactive Patient Education  2017 [employer].

## 2016-06-26 ENCOUNTER — Ambulatory Visit (INDEPENDENT_AMBULATORY_CARE_PROVIDER_SITE_OTHER): Payer: 59 | Admitting: Certified Nurse Midwife

## 2016-06-26 ENCOUNTER — Encounter: Payer: Self-pay | Admitting: Certified Nurse Midwife

## 2016-06-26 VITALS — BP 109/62 | HR 81 | Wt 145.3 lb

## 2016-06-26 DIAGNOSIS — Z3492 Encounter for supervision of normal pregnancy, unspecified, second trimester: Secondary | ICD-10-CM

## 2016-06-26 DIAGNOSIS — R3989 Other symptoms and signs involving the genitourinary system: Secondary | ICD-10-CM

## 2016-06-26 LAB — POCT URINALYSIS DIPSTICK
Bilirubin, UA: NEGATIVE
Blood, UA: NEGATIVE
Glucose, UA: NEGATIVE
KETONES UA: NEGATIVE
Leukocytes, UA: NEGATIVE
NITRITE UA: NEGATIVE
PH UA: 8
Protein, UA: NEGATIVE
Spec Grav, UA: 1.01
UROBILINOGEN UA: NEGATIVE

## 2016-06-26 MED ORDER — NITROFURANTOIN MONOHYD MACRO 100 MG PO CAPS: 100.0000 mg | ORAL_CAPSULE | Freq: Two times a day (BID) | ORAL | 1 refills | Status: DC

## 2016-06-26 NOTE — Patient Instructions (Signed)
Round Ligament Pain The round ligament is a cord of muscle and tissue that helps to support the uterus. It can become a source of pain during pregnancy if it becomes stretched or twisted as the baby grows. The pain usually begins in the second trimester of pregnancy, and it can come and go until the baby is delivered. It is not a serious problem, and it does not cause harm to the baby. Round ligament pain is usually a short, sharp, and pinching pain, but it can also be a dull, lingering, and aching pain. The pain is felt in the lower side of the abdomen or in the groin. It usually starts deep in the groin and moves up to the outside of the hip area. Pain can occur with:  A sudden change in position.  Rolling over in bed.  Coughing or sneezing.  Physical activity. Follow these instructions at home: Watch your condition for any changes. Take these steps to help with your pain:  When the pain starts, relax. Then try:  Sitting down.  Flexing your knees up to your abdomen.  Lying on your side with one pillow under your abdomen and another pillow between your legs.  Sitting in a warm bath for 15-20 minutes or until the pain goes away.  Take over-the-counter and prescription medicines only as told by your health care provider.  Move slowly when you sit and stand.  Avoid long walks if they cause pain.  Stop or lessen your physical activities if they cause pain. Contact a health care provider if:  Your pain does not go away with treatment.  You feel pain in your back that you did not have before.  Your medicine is not helping. Get help right away if:  You develop a fever or chills.  You develop uterine contractions.  You develop vaginal bleeding.  You develop nausea or vomiting.  You develop diarrhea.  You have pain when you urinate. This information is not intended to replace advice given to you by your health care provider. Make sure you discuss any questions you have with  your health care provider. Document Released: 01/21/2008 Document Revised: 09/19/2015 Document Reviewed: 06/20/2014 Elsevier Interactive Patient Education  2017 [employer] Inc[employer]  

## 2016-06-26 NOTE — Progress Notes (Signed)
ROB-Reports bladder pain, urinary frequency and urgency.  Urine culture and Rx Macrobid, see orders. Gender reveal was last weekend, they are having a little boy-Alexis Snow. Discussed round ligament pain and home treatment measures. Encourage prenatal yoga and use of birth affirmations. RTC x 4 weeks for anatomy scan and ROB.

## 2016-06-26 NOTE — Progress Notes (Signed)
Pt is c/o bladder pain. Also frequency and urgency

## 2016-06-28 LAB — URINE CULTURE

## 2016-07-15 ENCOUNTER — Telehealth: Payer: Self-pay | Admitting: Certified Nurse Midwife

## 2016-07-15 NOTE — Telephone Encounter (Signed)
Pt states she does not feel real bad, just a little tired. Is working from home today. Pt states her temp was 102 last night and with Tylenol and cool bath it came down to 100.5 orally. Pt has been taking tylenol and drinking lots of water to keep fever down. Pt denies symptoms of UTI and did not take Microbid on 06/26/16 because she felt better late that Friday afternoon. She looked on MyChart for her lab results of urine culture and this was negative so she never started it.  Currently pt states she thought she was getting allergies: nose running, coughing from nose running, sore throat probably due to  post nasal drip. Pt did ask if extra Vitamin C is okay, and was advised not to take any extra.  Pt will continue to monitor her fever and symptoms and will contact office if they persist.

## 2016-07-15 NOTE — Telephone Encounter (Signed)
Pt states 18 wk 4 days. Pt had fever 100.5 last night. Fever 102. This a.m. Tylenol and water and cool bath only brought it down to 99.5. Please advise.

## 2016-07-16 ENCOUNTER — Encounter: Payer: Self-pay | Admitting: Certified Nurse Midwife

## 2016-07-16 ENCOUNTER — Ambulatory Visit (INDEPENDENT_AMBULATORY_CARE_PROVIDER_SITE_OTHER): Payer: 59 | Admitting: Certified Nurse Midwife

## 2016-07-16 VITALS — BP 107/62 | HR 133 | Temp 98.9°F | Wt 147.9 lb

## 2016-07-16 DIAGNOSIS — R509 Fever, unspecified: Secondary | ICD-10-CM | POA: Diagnosis not present

## 2016-07-16 LAB — POCT URINALYSIS DIPSTICK
BILIRUBIN UA: NEGATIVE
Blood, UA: NEGATIVE
Glucose, UA: NEGATIVE
Leukocytes, UA: NEGATIVE
Nitrite, UA: NEGATIVE
Protein, UA: NEGATIVE
Spec Grav, UA: 1.01 (ref 1.030–1.035)
Urobilinogen, UA: NEGATIVE (ref ?–2.0)
pH, UA: 7 (ref 5.0–8.0)

## 2016-07-16 NOTE — Progress Notes (Signed)
Continues running fever. Am temp. 101, took tylenol and 1pm temp 99.8.  Pulse 133. Pt states she heard her heart beat last night while trying to sleep and it was fast.  Has a little nausea.

## 2016-07-16 NOTE — Progress Notes (Signed)
Subjective:   Alexis Snow is a 26 y.o. G2P0010 [redacted]w[redacted]d being seen today for work in problem visit.    Reports fever, itchy eyes, sneezing, occasional non-productive cough, aches, and clear nasal drainage x two (2) days.   Denies sick or flu contacts. She did not get a flu shot this year and has been avoiding "crowds and sick people".  She has been taking Tylenol every six (6) hours and benadryl nightly with temporary relief of symptoms.   She had one (1) 16 oz bottle of water today.  She is worried about the baby and the implications of fever during pregnancy.   Denies difficulty breathing or respiratory distress, chest pain, abdominal pain, vaginal bleeding and leg swelling or pain.   The following portions of the patient's history were reviewed and updated as appropriate: allergies, current medications, past family history, past medical history, past social history, past surgical history and problem list.   Objective:  BP 107/62   Pulse (!) 133   Temp 98.9 F (37.2 C)   Wt 147 lb 14.4 oz (67.1 kg)   LMP 03/09/2016 (Exact Date)   BMI 28.88 kg/m   Physical exam  GENERAL: Alert and oriented x 4 in no apparent distress   RESPIRATORY: lungs clear to ausculation bilaterally, O2 sat 98%  CARDIAC: Rhythm regular, rate 120 bpm  ABDOMEN: soft, gravid, non-tender. No CVAT    Results for orders placed or performed in visit on 07/16/16 (from the past 24 hour(s))  POCT urinalysis dipstick     Status: None   Collection Time: 07/16/16  4:10 PM  Result Value Ref Range   Color, UA yellow    Clarity, UA cloudy    Glucose, UA negative    Bilirubin, UA negative    Ketones, UA moderate    Spec Grav, UA 1.010 1.030 - 1.035   Blood, UA negative    pH, UA 7.0 5.0 - 8.0   Protein, UA negative    Urobilinogen, UA negative Negative - 2.0   Nitrite, UA negative    Leukocytes, UA Negative Negative    Assessment:   Pregnancy:  G2P0010 at [redacted]w[redacted]d  1. Fever and chills  - POCT urinalysis  dipstick - Culture, OB Urine - CBC with Differential/Platelet   Plan:   CBC and urine culture, will contact patient via MyChart with results.  Encouraged hydration with an electrolyte drink (Gatorade, Powerade, or Pedialyte) and water.   Continue symptom management with OTC medications. A highlighted safe medication sheet was given to patient.   Reviewed red flag symptoms and when to call.  Follow up as previously scheduled or sooner if needed   Diona Fanti, CNM

## 2016-07-16 NOTE — Telephone Encounter (Signed)
Patient just wants to know what to do about her fever that she's had since Tuesday and is not going away with Tylenol. Please advise.

## 2016-07-17 LAB — CBC WITH DIFFERENTIAL/PLATELET
BASOS: 0 %
Basophils Absolute: 0 10*3/uL (ref 0.0–0.2)
EOS (ABSOLUTE): 0 10*3/uL (ref 0.0–0.4)
Eos: 0 %
Hematocrit: 32.2 % — ABNORMAL LOW (ref 34.0–46.6)
Hemoglobin: 10.8 g/dL — ABNORMAL LOW (ref 11.1–15.9)
Immature Grans (Abs): 0 10*3/uL (ref 0.0–0.1)
Immature Granulocytes: 1 %
Lymphocytes Absolute: 0.6 10*3/uL — ABNORMAL LOW (ref 0.7–3.1)
Lymphs: 10 %
MCH: 32.4 pg (ref 26.6–33.0)
MCHC: 33.5 g/dL (ref 31.5–35.7)
MCV: 97 fL (ref 79–97)
MONOS ABS: 0.4 10*3/uL (ref 0.1–0.9)
Monocytes: 8 %
Neutrophils Absolute: 4.4 10*3/uL (ref 1.4–7.0)
Neutrophils: 81 %
PLATELETS: 164 10*3/uL (ref 150–379)
RBC: 3.33 x10E6/uL — ABNORMAL LOW (ref 3.77–5.28)
RDW: 13.1 % (ref 12.3–15.4)
WBC: 5.5 10*3/uL (ref 3.4–10.8)

## 2016-07-19 LAB — URINE CULTURE, OB REFLEX

## 2016-07-19 LAB — CULTURE, OB URINE

## 2016-07-31 ENCOUNTER — Ambulatory Visit (INDEPENDENT_AMBULATORY_CARE_PROVIDER_SITE_OTHER): Payer: 59 | Admitting: Certified Nurse Midwife

## 2016-07-31 ENCOUNTER — Encounter: Payer: Self-pay | Admitting: Certified Nurse Midwife

## 2016-07-31 ENCOUNTER — Ambulatory Visit (INDEPENDENT_AMBULATORY_CARE_PROVIDER_SITE_OTHER): Payer: 59

## 2016-07-31 VITALS — BP 89/48 | HR 89 | Wt 147.4 lb

## 2016-07-31 DIAGNOSIS — Z3482 Encounter for supervision of other normal pregnancy, second trimester: Secondary | ICD-10-CM

## 2016-07-31 DIAGNOSIS — O4440 Low lying placenta NOS or without hemorrhage, unspecified trimester: Secondary | ICD-10-CM

## 2016-07-31 DIAGNOSIS — Z3492 Encounter for supervision of normal pregnancy, unspecified, second trimester: Secondary | ICD-10-CM | POA: Diagnosis not present

## 2016-07-31 LAB — POCT URINALYSIS DIPSTICK
Bilirubin, UA: NEGATIVE
Blood, UA: NEGATIVE
Glucose, UA: NEGATIVE
KETONES UA: NEGATIVE
LEUKOCYTES UA: NEGATIVE
Nitrite, UA: NEGATIVE
PH UA: 7.5 (ref 5.0–8.0)
PROTEIN UA: NEGATIVE
Spec Grav, UA: 1.01 (ref 1.030–1.035)
Urobilinogen, UA: NEGATIVE (ref ?–2.0)

## 2016-07-31 NOTE — Progress Notes (Signed)
ROB-and ant c/o of  pressure. Advised belly band. F/u anatomy 1-2 weeks. 28 week scan for LLP.

## 2016-07-31 NOTE — Progress Notes (Signed)
ROB-Discussed round ligament pain and home treatment measures including warm baths and abdominal support. Reviewed US findings, incomplete anatomy and low lying placenta. Advised pt that we would repeat US at 28 weeks to reevaluate placenta locations. Reviewed red flag symptom and when to call. RTC x 1-2 weeks for f/u ultrasound due to incomplete anatomy scan. RTC x 4 weeks for ROB.   ULTRASOUND REPORT  Location: [facility] Date of Service: 07/31/16  Indications:Anatomy U/S Findings:  Alexis Snow intrauterine pregnancy is visualized with FHR at 145 BPM. Biometrics give an (U/S) Gestational age of 70 6/7 weeks and an (U/S) EDD of 12/12/16; this correlates with the clinically established EDD of 12/14/16.  Fetal presentation is Vertex. Spine posterior EFW:375g (13oz). Placenta: Anterior, grade 0, 2.3cm from internal os (low lying). AFI: Adequate with MVP of 4.7 cm.  Anatomic survey is incomplete and normal; Gender - female  . Spine is suboptimal due to fetal position.  Ovaries are not visualized. Survey of the adnexa demonstrates no adnexal masses. There is no free peritoneal fluid in the cul de sac.  Impression: 1. 20 6/7 week Viable Singleton Intrauterine pregnancy by U/S. 2. (U/S) EDD is consistent with Clinically established (LMP) EDD of 12/14/16. 3. Incomplete Anatomy Scan - need spine views  Recommendations: 1.Clinical correlation with the patient's History and Physical Exam. 2. Return in 1--2 weeks for anatomy follow up. 3. Return at 28 weeks to re evaluate low lying placenta   Diona Fanti, CNM

## 2016-07-31 NOTE — Patient Instructions (Signed)
Round Ligament Pain The round ligament is a cord of muscle and tissue that helps to support the uterus. It can become a source of pain during pregnancy if it becomes stretched or twisted as the baby grows. The pain usually begins in the second trimester of pregnancy, and it can come and go until the baby is delivered. It is not a serious problem, and it does not cause harm to the baby. Round ligament pain is usually a short, sharp, and pinching pain, but it can also be a dull, lingering, and aching pain. The pain is felt in the lower side of the abdomen or in the groin. It usually starts deep in the groin and moves up to the outside of the hip area. Pain can occur with:  A sudden change in position.  Rolling over in bed.  Coughing or sneezing.  Physical activity. Follow these instructions at home: Watch your condition for any changes. Take these steps to help with your pain:  When the pain starts, relax. Then try:  Sitting down.  Flexing your knees up to your abdomen.  Lying on your side with one pillow under your abdomen and another pillow between your legs.  Sitting in a warm bath for 15-20 minutes or until the pain goes away.  Take over-the-counter and prescription medicines only as told by your health care provider.  Move slowly when you sit and stand.  Avoid long walks if they cause pain.  Stop or lessen your physical activities if they cause pain. Contact a health care provider if:  Your pain does not go away with treatment.  You feel pain in your back that you did not have before.  Your medicine is not helping. Get help right away if:  You develop a fever or chills.  You develop uterine contractions.  You develop vaginal bleeding.  You develop nausea or vomiting.  You develop diarrhea.  You have pain when you urinate. This information is not intended to replace advice given to you by your health care provider. Make sure you discuss any questions you have with  your health care provider. Document Released: 01/21/2008 Document Revised: 09/19/2015 Document Reviewed: 06/20/2014 Elsevier Interactive Patient Education  2017 [employer].

## 2016-08-14 ENCOUNTER — Ambulatory Visit (INDEPENDENT_AMBULATORY_CARE_PROVIDER_SITE_OTHER): Payer: 59

## 2016-08-14 DIAGNOSIS — Z3482 Encounter for supervision of other normal pregnancy, second trimester: Secondary | ICD-10-CM

## 2016-08-28 ENCOUNTER — Ambulatory Visit (INDEPENDENT_AMBULATORY_CARE_PROVIDER_SITE_OTHER): Payer: 59 | Admitting: Certified Nurse Midwife

## 2016-08-28 ENCOUNTER — Encounter: Payer: 59 | Admitting: Certified Nurse Midwife

## 2016-08-28 VITALS — BP 106/70 | HR 88 | Wt 150.1 lb

## 2016-08-28 DIAGNOSIS — Z3490 Encounter for supervision of normal pregnancy, unspecified, unspecified trimester: Secondary | ICD-10-CM | POA: Insufficient documentation

## 2016-08-28 DIAGNOSIS — Z3482 Encounter for supervision of other normal pregnancy, second trimester: Secondary | ICD-10-CM

## 2016-08-28 DIAGNOSIS — Z3A24 24 weeks gestation of pregnancy: Secondary | ICD-10-CM

## 2016-08-28 LAB — POCT URINALYSIS DIPSTICK
Bilirubin, UA: NEGATIVE
Glucose, UA: NEGATIVE
KETONES UA: 40
Leukocytes, UA: NEGATIVE
Nitrite, UA: NEGATIVE
PROTEIN UA: NEGATIVE
RBC UA: NEGATIVE
SPEC GRAV UA: 1.02 (ref 1.010–1.025)
Urobilinogen, UA: 0.2 E.U./dL
pH, UA: 6.5 (ref 5.0–8.0)

## 2016-08-28 NOTE — Progress Notes (Signed)
ROB doing well with no complaints. Discussed weight gain in pregnancy, decreasing salt intake , po hydration, avoiding simple carbohydrates. Reviewed the Glucola testing at next visit. Also encouraged her to start thinking about the classes offered and begin to register. We briefly discussed birth plan. Encouraged Iridian to begin thinking about what she would like her birth plan to be. She denies LOF, vaginal bleeding and contractions. She endorses good fetal movement. She will return in 4 wks for ROB and 1 hr GTT.  Philip Aspen, CNM

## 2016-08-28 NOTE — Patient Instructions (Signed)

## 2016-09-08 ENCOUNTER — Observation Stay
Admission: RE | Admit: 2016-09-08 | Discharge: 2016-09-08 | Disposition: A | Discharge status: Home / Self Care | Payer: 59 | Attending: Obstetrics and Gynecology | Admitting: Obstetrics and Gynecology

## 2016-09-08 DIAGNOSIS — R109 Unspecified abdominal pain: Secondary | ICD-10-CM | POA: Diagnosis present

## 2016-09-08 DIAGNOSIS — Z3A26 26 weeks gestation of pregnancy: Secondary | ICD-10-CM

## 2016-09-08 DIAGNOSIS — O26892 Other specified pregnancy related conditions, second trimester: Secondary | ICD-10-CM | POA: Diagnosis not present

## 2016-09-08 DIAGNOSIS — R102 Pelvic and perineal pain: Secondary | ICD-10-CM | POA: Diagnosis not present

## 2016-09-08 NOTE — Progress Notes (Signed)
Spoke with Philip Aspen CNM. Gave her patient symptoms and update. Pt states pain is 0/10. Told once, we had  good strip on the baby, pt could go home. If pain returns, follow up in office.

## 2016-09-08 NOTE — OB Triage Note (Addendum)
Pt presents c/o stabbing pain in her lower abdomen, as well as describing it as a "pinch of the cervix when you get a vaginal exam with a speculum". This all started around 11:00 today. Denies any bleeding, LOF, burning when urinating. Last intercourse was Sunday.

## 2016-09-08 NOTE — Discharge Instructions (Signed)
Fetal Movement Counts Patient Name: ________________________________________________ Patient Due Date: ____________________ What is a fetal movement count? A fetal movement count is the number of times that you feel your baby move during a certain amount of time. This may also be called a fetal kick count. A fetal movement count is recommended for every pregnant woman. You may be asked to start counting fetal movements as early as week 28 of your pregnancy. Pay attention to when your baby is most active. You may notice your baby's sleep and wake cycles. You may also notice things that make your baby move more. You should do a fetal movement count:  When your baby is normally most active.  At the same time each day. A good time to count movements is while you are resting, after having something to eat and drink. How do I count fetal movements? 1. Find a quiet, comfortable area. Sit, or lie down on your side. 2. Write down the date, the start time and stop time, and the number of movements that you felt between those two times. Take this information with you to your health care visits. 3. For 2 hours, count kicks, flutters, swishes, rolls, and jabs. You should feel at least 10 movements during 2 hours. 4. You may stop counting after you have felt 10 movements. 5. If you do not feel 10 movements in 2 hours, have something to eat and drink. Then, keep resting and counting for 1 hour. If you feel at least 4 movements during that hour, you may stop counting. Contact a health care provider if:  You feel fewer than 4 movements in 2 hours.  Your baby is not moving like he or she usually does. Date: ____________ Start time: ____________ Stop time: ____________ Movements: ____________ Date: ____________ Start time: ____________ Stop time: ____________ Movements: ____________ Date: ____________ Start time: ____________ Stop time: ____________ Movements: ____________ Date: ____________ Start time:  ____________ Stop time: ____________ Movements: ____________ Date: ____________ Start time: ____________ Stop time: ____________ Movements: ____________ Date: ____________ Start time: ____________ Stop time: ____________ Movements: ____________ Date: ____________ Start time: ____________ Stop time: ____________ Movements: ____________ Date: ____________ Start time: ____________ Stop time: ____________ Movements: ____________ Date: ____________ Start time: ____________ Stop time: ____________ Movements: ____________ This information is not intended to replace advice given to you by your health care provider. Make sure you discuss any questions you have with your health care provider. Document Released: 05/13/2006 Document Revised: 12/11/2015 Document Reviewed: 05/23/2015 Elsevier Interactive Patient Education  2017 [place]: When contractions begin, you should start to time them from the beginning of one contraction to the beginning of the next.  When contractions are 5-10 minutes apart or less and have been regular for at least an hour, you should call your health care provider.  Notify your doctor if any of the following occur: 1. Bleeding from the vagina 7. Sudden, constant, or occasional abdominal pain  2. Pain or burning when urinating 8. Sudden gushing of fluid from the vagina (with or without continued leaking)  3. Chills or fever 9. Fainting spells, "black outs" or loss of consciousness  4. Increase in vaginal discharge 10. Severe or continued nausea or vomiting  5. Pelvic pressure (sudden increase) 11. Blurring of vision or spots before the eyes  6. Baby moving less than usual 12. Leaking of fluid    FETAL KICK COUNT: Lie on your left side for one hour after a meal, and count the number of times your baby kicks. If it  is less than 5 times, get up, move around and drink some juice. Repeat the test 30 minutes later. If it is still less than 5 kicks in an hour, notify your  doctor.

## 2016-09-08 NOTE — Progress Notes (Signed)
   L&D OB Triage Note  SUBJECTIVE Alexis Snow is a 26 y.o. G2P0010 female at [redacted]w[redacted]d, EDD Estimated Date of Delivery: 12/14/16 who presented to triage with complaints of a pain in her lower abdomen and vaginal today around 1100. She denies LOF, Vaginal bleeding, vaginal discharge that has odor, burning, or itching. She also denies contractions or cramping. She Endorses good fetal movement. The pain has since subsided.   Obstetric History   G2   P0   T0   P0   A1   L0    SAB0   TAB1   Ectopic0   Multiple0   Live Births0     # Outcome Date GA Lbr Len/2nd Weight Sex Delivery Anes PTL Lv  2 Current           1 TAB 2013              Prescriptions Prior to Admission  Medication Sig Dispense Refill Last Dose  . Prenatal Vit-Fe Fumarate-FA (PRENATAL MULTIVITAMIN) TABS tablet Take 1 tablet by mouth daily at 12 noon.   09/07/2016 at Unknown time     OBJECTIVE  Nursing Evaluation:   BP 111/70 (BP Location: Left Arm)   Pulse 98   Temp 98.2 F (36.8 C) (Oral)   Resp 18   Ht 5' (1.524 m)   Wt 150 lb (68 kg)   LMP 03/09/2016 (Exact Date)   SpO2 100%   BMI 29.29 kg/m     Braylie was evaluated by the nurse. NST was performed and has been reviewed by me.   NST INTERPRETATION: appropriate for getstional age  Baseline 145 Moderate variability variable deceleration , No contractions  Fetal movement present.   SVE: not indicated   ASSESSMENT Impression:  1. Pregnancy:  G2P0010 at [redacted]w[redacted]d , EDD Estimated Date of Delivery: 12/14/16 2.  Appropriate for 26 wks.   PLAN 1. Reassurance given, nurse instructed to teach on round ligament pain and common discomforts of pregnancy. Encouraged pt to follow up in the office sooner than next scheduled appointment if pain returns. Encourage use of tylenol for pain and PO hydration.  2. Discharge home with precautions to return to L&D or call the office if:  increased leakage or fluid, decreased fetal movement, persistent low back pain or cramping,  bleeding from vaginal area, pain with urination or cramping that is more than 5 times an hour.   3. Continue routine prenatal care.  Philip Aspen, CNM

## 2016-09-09 NOTE — Discharge Summary (Signed)
L&D OB Triage Note  SUBJECTIVE Alexis Snow is a 26 y.o. G2P0010 female at [redacted]w[redacted]d, EDD Estimated Date of Delivery: 12/14/16. She  presented to triage with complaints of a pain in her lower abdomen and vaginal pain today around 1100. She denies LOF, Vaginal bleeding, vaginal discharge that has odor, burning, or itching. She also denies contractions or cramping. She Endorses good fetal movement. The pain has since subsided.               Obstetric History   G2   P0   T0   P0   A1   L0    SAB0   TAB1   Ectopic0   Multiple0   Live Births0     # Outcome Date GA Lbr Len/2nd Weight Sex Delivery Anes PTL Lv  2 Current           1 TAB 2013                     Prescriptions Prior to Admission  Medication Sig Dispense Refill Last Dose  . Prenatal Vit-Fe Fumarate-FA (PRENATAL MULTIVITAMIN) TABS tablet Take 1 tablet by mouth daily at 12 noon.   09/07/2016 at Unknown time     OBJECTIVE             Nursing Evaluation:                         BP 111/70 (BP Location: Left Arm)   Pulse 98   Temp 98.2 F (36.8 C) (Oral)   Resp 18   Ht 5' (1.524 m)   Wt 150 lb (68 kg)   LMP 03/09/2016 (Exact Date)   SpO2 100%   BMI 29.29 kg/m                Aimar was evaluated by the nurse. NST was performed and has been reviewed by me.   NST INTERPRETATION: appropriate for getstional age  Baseline 145 Moderate variability variable deceleration, Maternal movement noted by RN  No contractions  Fetal movement present.   SVE: not indicated   ASSESSMENT Impression:  1. Pregnancy:  G2P0010 at [redacted]w[redacted]d , EDD Estimated Date of Delivery: 12/14/16 2.  Appropriate for 26 wks.   PLAN 1. Reassurance given, nurse instructed to teach on round ligament pain and common discomforts of pregnancy. Encouraged pt to follow up in the office sooner than next scheduled appointment if pain returns. Encourage use of tylenol for pain and PO hydration.  2. Discharge home with precautions to  return to L&D or call the office if:  increased leakage or fluid, decreased fetal movement, persistent low back pain or cramping, bleeding from vaginal area, pain with urination or cramping that is more than 5 times an hour.   3. Continue routine prenatal care.  Philip Aspen, CNM

## 2016-09-25 ENCOUNTER — Other Ambulatory Visit: Payer: 59

## 2016-09-25 ENCOUNTER — Encounter: Payer: Self-pay | Admitting: Certified Nurse Midwife

## 2016-09-25 ENCOUNTER — Ambulatory Visit (INDEPENDENT_AMBULATORY_CARE_PROVIDER_SITE_OTHER): Payer: 59 | Admitting: Certified Nurse Midwife

## 2016-09-25 VITALS — BP 110/73 | HR 100 | Wt 152.0 lb

## 2016-09-25 DIAGNOSIS — Z23 Encounter for immunization: Secondary | ICD-10-CM | POA: Diagnosis not present

## 2016-09-25 DIAGNOSIS — Z3493 Encounter for supervision of normal pregnancy, unspecified, third trimester: Secondary | ICD-10-CM

## 2016-09-25 LAB — POCT URINALYSIS DIPSTICK
Bilirubin, UA: NEGATIVE
GLUCOSE UA: NEGATIVE
Ketones, UA: NEGATIVE
LEUKOCYTES UA: NEGATIVE
NITRITE UA: NEGATIVE
PH UA: 6 (ref 5.0–8.0)
Protein, UA: NEGATIVE
RBC UA: NEGATIVE
Spec Grav, UA: 1.01 (ref 1.010–1.025)
UROBILINOGEN UA: 0.2 U/dL

## 2016-09-25 NOTE — Patient Instructions (Addendum)
DTaP Vaccine (Diphtheria, Tetanus, and Pertussis): What You Need to Know 1. Why get vaccinated? Diphtheria, tetanus, and pertussis are serious diseases caused by bacteria. Diphtheria and pertussis are spread from person to person. Tetanus enters the body through cuts or wounds. DIPHTHERIA causes a thick covering in the back of the throat. It can lead to breathing problems, paralysis, heart failure, and even death.  TETANUS (Lockjaw) causes painful tightening of the muscles, usually all over the body. It can lead to "locking" of the jaw so the victim cannot open his mouth or swallow. Tetanus leads to death in up to 2 out of 10 cases.  PERTUSSIS (Whooping Cough) causes coughing spells so bad that it is hard for infants to eat, drink, or breathe. These spells can last for weeks. It can lead to pneumonia, seizures (jerking and staring spells), brain damage, and death.  Diphtheria, tetanus, and pertussis vaccine (DTaP) can help prevent these diseases. Most children who are vaccinated with DTaP will be protected throughout childhood. Many more children would get these diseases if we stopped vaccinating. DTaP is a safer version of an older vaccine called DTP. DTP is no longer used in the [place]. 2. Who should get DTaP vaccine and when? Children should get 5 doses of DTaP vaccine, one dose at each of the following ages: 2 months 4 months 6 months 15-18 months 4-6 years  DTaP may be given at the same time as other vaccines. 3. Some children should not get DTaP vaccine or should wait Children with minor illnesses, such as a cold, may be vaccinated. But children who are moderately or severely ill should usually wait until they recover before getting DTaP vaccine. Any child who had a life-threatening allergic reaction after a dose of DTaP should not get another dose. Any child who suffered a brain or nervous system disease within 7 days after a dose of DTaP should not get another dose. Talk  with your doctor if your child: had a seizure or collapsed after a dose of DTaP, cried non-stop for 3 hours or more after a dose of DTaP, had a fever over 105F after a dose of DTaP. Ask your doctor for more information. Some of these children should not get another dose of pertussis vaccine, but may get a vaccine without pertussis, called DT. 4. Older children and adults DTaP is not licensed for adolescents, adults, or children 27 years of age and older. But older people still need protection. A vaccine called Tdap is similar to DTaP. A single dose of Tdap is recommended for people 11 through 27 years of age. Another vaccine, called Td, protects against tetanus and diphtheria, but not pertussis. It is recommended every 10 years. There are separate Vaccine Information Statements for these vaccines. 5. What are the risks from DTaP vaccine? Getting diphtheria, tetanus, or pertussis disease is much riskier than getting DTaP vaccine. However, a vaccine, like any medicine, is capable of causing serious problems, such as severe allergic reactions. The risk of DTaP vaccine causing serious harm, or death, is extremely small. Mild problems (common) Fever (up to about 1 child in 4) Redness or swelling where the shot was given (up to about 1 child in 4) Soreness or tenderness where the shot was given (up to about 1 child in 4) These problems occur more often after the 4th and 5th doses of the DTaP series than after earlier doses. Sometimes the 4th or 5th dose of DTaP vaccine is followed by swelling of the entire  arm or leg in which the shot was given, lasting 1-7 days (up to about 1 child in 45). Other mild problems include: Fussiness (up to about 1 child in 3) Tiredness or poor appetite (up to about 1 child in 10) Vomiting (up to about 1 child in 78) These problems generally occur 1-3 days after the shot. Moderate problems (uncommon) Seizure (jerking or staring) (about 1 child out of 14,000) Non-stop  crying, for 3 hours or more (up to about 1 child out of 1,000) High fever, over 105F (about 1 child out of 16,000) Severe problems (very rare) Serious allergic reaction (less than 1 out of a million doses) Several other severe problems have been reported after DTaP vaccine. These include: Long-term seizures, coma, or lowered consciousness Permanent brain damage. These are so rare it is hard to tell if they are caused by the vaccine. Controlling fever is especially important for children who have had seizures, for any reason. It is also important if another family member has had seizures. You can reduce fever and pain by giving your child an aspirin-free pain reliever when the shot is given, and for the next 24 hours, following the package instructions. 6. What if there is a serious reaction? What should I look for? Look for anything that concerns you, such as signs of a severe allergic reaction, very high fever, or behavior changes. Signs of a severe allergic reaction can include hives, swelling of the face and throat, difficulty breathing, a fast heartbeat, dizziness, and weakness. These would start a few minutes to a few hours after the vaccination. What should I do? If you think it is a severe allergic reaction or other emergency that can't wait, call 9-1-1 or get the person to the nearest hospital. Otherwise, call your doctor. Afterward, the reaction should be reported to the Vaccine Adverse Event Reporting System (VAERS). Your doctor might file this report, or you can do it yourself through the VAERS web site at www.vaers.SamedayNews.es, or by calling 425 157 4423. VAERS is only for reporting reactions. They do not give medical advice. 7. The National Vaccine Injury Compensation Program The [employer] Vaccine Injury Compensation Program (VICP) is a federal program that was created to compensate people who may have been injured by certain vaccines. Persons who believe they may have been injured by a  vaccine can learn about the program and about filing a claim by calling (919)202-5755 or visiting the [employer] website at GoldCloset.com.ee. 8. How can I learn more? Ask your doctor. Call your local or state health department. Contact the Centers for Disease Control and Prevention (CDC): Call (360)274-3377 (1-800-CDC-INFO) or Visit CDC's website at http://hunter.com/ CDC DTaP Vaccine (Diphtheria, Tetanus, and Pertussis) VIS (09/10/05) This information is not intended to replace advice given to you by your health care provider. Make sure you discuss any questions you have with your health care provider. Document Released: 02/08/2006 Document Revised: 01/02/2016 Document Reviewed: 01/02/2016 Elsevier Interactive Patient Education  2017 [place]. Fetal Movement Counts Patient Name: ________________________________________________ Patient Due Date: ____________________ What is a fetal movement count? A fetal movement count is the number of times that you feel your baby move during a certain amount of time. This may also be called a fetal kick count. A fetal movement count is recommended for every pregnant woman. You may be asked to start counting fetal movements as early as week 28 of your pregnancy. Pay attention to when your baby is most active. You may notice your baby's sleep and wake cycles. You  may also notice things that make your baby move more. You should do a fetal movement count: When your baby is normally most active. At the same time each day.  A good time to count movements is while you are resting, after having something to eat and drink. How do I count fetal movements? Find a quiet, comfortable area. Sit, or lie down on your side. Write down the date, the start time and stop time, and the number of movements that you felt between those two times. Take this information with you to your health care visits. For 2 hours, count kicks, flutters, swishes, rolls, and  jabs. You should feel at least 10 movements during 2 hours. You may stop counting after you have felt 10 movements. If you do not feel 10 movements in 2 hours, have something to eat and drink. Then, keep resting and counting for 1 hour. If you feel at least 4 movements during that hour, you may stop counting. Contact a health care provider if: You feel fewer than 4 movements in 2 hours. Your baby is not moving like he or she usually does. Date: ____________ Start time: ____________ Stop time: ____________ Movements: ____________ Date: ____________ Start time: ____________ Stop time: ____________ Movements: ____________ Date: ____________ Start time: ____________ Stop time: ____________ Movements: ____________ Date: ____________ Start time: ____________ Stop time: ____________ Movements: ____________ Date: ____________ Start time: ____________ Stop time: ____________ Movements: ____________ Date: ____________ Start time: ____________ Stop time: ____________ Movements: ____________ Date: ____________ Start time: ____________ Stop time: ____________ Movements: ____________ Date: ____________ Start time: ____________ Stop time: ____________ Movements: ____________ Date: ____________ Start time: ____________ Stop time: ____________ Movements: ____________ This information is not intended to replace advice given to you by your health care provider. Make sure you discuss any questions you have with your health care provider. Document Released: 05/13/2006 Document Revised: 12/11/2015 Document Reviewed: 05/23/2015 Elsevier Interactive Patient Education  2018 [place] of Pregnancy The third trimester is from week 28 through week 40 (months 7 through 9). The third trimester is a time when the unborn baby (fetus) is growing rapidly. At the end of the ninth month, the fetus is about 20 inches in length and weighs 6-10 pounds. Body changes during your third trimester Your body will  continue to go through many changes during pregnancy. The changes vary from woman to woman. During the third trimester:  Your weight will continue to increase. You can expect to gain 25-35 pounds (11-16 kg) by the end of the pregnancy.  You may begin to get stretch marks on your hips, abdomen, and breasts.  You may urinate more often because the fetus is moving lower into your pelvis and pressing on your bladder.  You may develop or continue to have heartburn. This is caused by increased hormones that slow down muscles in the digestive tract.  You may develop or continue to have constipation because increased hormones slow digestion and cause the muscles that push waste through your intestines to relax.  You may develop hemorrhoids. These are swollen veins (varicose veins) in the rectum that can itch or be painful.  You may develop swollen, bulging veins (varicose veins) in your legs.  You may have increased body aches in the pelvis, back, or thighs. This is due to weight gain and increased hormones that are relaxing your joints.  You may have changes in your hair. These can include thickening of your hair, rapid growth, and changes in texture. Some women also have hair  loss during or after pregnancy, or hair that feels dry or thin. Your hair will most likely return to normal after your baby is born.  Your breasts will continue to grow and they will continue to become tender. A yellow fluid (colostrum) may leak from your breasts. This is the first milk you are producing for your baby.  Your belly button may stick out.  You may notice more swelling in your hands, face, or ankles.  You may have increased tingling or numbness in your hands, arms, and legs. The skin on your belly may also feel numb.  You may feel short of breath because of your expanding uterus.  You may have more problems sleeping. This can be caused by the size of your belly, increased need to urinate, and an increase in  your body's metabolism.  You may notice the fetus "dropping," or moving lower in your abdomen (lightening).  You may have increased vaginal discharge.  You may notice your joints feel loose and you may have pain around your pelvic bone.  What to expect at prenatal visits You will have prenatal exams every 2 weeks until week 36. Then you will have weekly prenatal exams. During a routine prenatal visit:  You will be weighed to make sure you and the baby are growing normally.  Your blood pressure will be taken.  Your abdomen will be measured to track your baby's growth.  The fetal heartbeat will be listened to.  Any test results from the previous visit will be discussed.  You may have a cervical check near your due date to see if your cervix has softened or thinned (effaced).  You will be tested for Group B streptococcus. This happens between 35 and 37 weeks.  Your health care provider may ask you:  What your birth plan is.  How you are feeling.  If you are feeling the baby move.  If you have had any abnormal symptoms, such as leaking fluid, bleeding, severe headaches, or abdominal cramping.  If you are using any tobacco products, including cigarettes, chewing tobacco, and electronic cigarettes.  If you have any questions.  Other tests or screenings that may be performed during your third trimester include:  Blood tests that check for low iron levels (anemia).  Fetal testing to check the health, activity level, and growth of the fetus. Testing is done if you have certain medical conditions or if there are problems during the pregnancy.  Nonstress test (NST). This test checks the health of your baby to make sure there are no signs of problems, such as the baby not getting enough oxygen. During this test, a belt is placed around your belly. The baby is made to move, and its heart rate is monitored during movement.  What is false labor? False labor is a condition in which  you feel small, irregular tightenings of the muscles in the womb (contractions) that usually go away with rest, changing position, or drinking water. These are called Braxton Hicks contractions. Contractions may last for hours, days, or even weeks before true labor sets in. If contractions come at regular intervals, become more frequent, increase in intensity, or become painful, you should see your health care provider. What are the signs of labor?  Abdominal cramps.  Regular contractions that start at 10 minutes apart and become stronger and more frequent with time.  Contractions that start on the top of the uterus and spread down to the lower abdomen and back.  Increased pelvic pressure  and dull back pain.  A watery or bloody mucus discharge that comes from the vagina.  Leaking of amniotic fluid. This is also known as your "water breaking." It could be a slow trickle or a gush. Let your health care provider know if it has a color or strange odor. If you have any of these signs, call your health care provider right away, even if it is before your due date. Follow these instructions at home: Medicines  Follow your health care provider's instructions regarding medicine use. Specific medicines may be either safe or unsafe to take during pregnancy.  Take a prenatal vitamin that contains at least 600 micrograms (mcg) of folic acid.  If you develop constipation, try taking a stool softener if your health care provider approves. Eating and drinking  Eat a balanced diet that includes fresh fruits and vegetables, whole grains, good sources of protein such as meat, eggs, or tofu, and low-fat dairy. Your health care provider will help you determine the amount of weight gain that is right for you.  Avoid raw meat and uncooked cheese. These carry germs that can cause birth defects in the baby.  If you have low calcium intake from food, talk to your health care provider about whether you should take  a daily calcium supplement.  Eat four or five small meals rather than three large meals a day.  Limit foods that are high in fat and processed sugars, such as fried and sweet foods.  To prevent constipation: ? Drink enough fluid to keep your urine clear or pale yellow. ? Eat foods that are high in fiber, such as fresh fruits and vegetables, whole grains, and beans. Activity  Exercise only as directed by your health care provider. Most women can continue their usual exercise routine during pregnancy. Try to exercise for 30 minutes at least 5 days a week. Stop exercising if you experience uterine contractions.  Avoid heavy lifting.  Do not exercise in extreme heat or humidity, or at high altitudes.  Wear low-heel, comfortable shoes.  Practice good posture.  You may continue to have sex unless your health care provider tells you otherwise. Relieving pain and discomfort  Take frequent breaks and rest with your legs elevated if you have leg cramps or low back pain.  Take warm sitz baths to soothe any pain or discomfort caused by hemorrhoids. Use hemorrhoid cream if your health care provider approves.  Wear a good support bra to prevent discomfort from breast tenderness.  If you develop varicose veins: ? Wear support pantyhose or compression stockings as told by your healthcare provider. ? Elevate your feet for 15 minutes, 3-4 times a day. Prenatal care  Write down your questions. Take them to your prenatal visits.  Keep all your prenatal visits as told by your health care provider. This is important. Safety  Wear your seat belt at all times when driving.  Make a list of emergency phone numbers, including numbers for family, friends, the hospital, and police and fire departments. General instructions  Avoid cat litter boxes and soil used by cats. These carry germs that can cause birth defects in the baby. If you have a cat, ask someone to clean the litter box for you.  Do not  travel far distances unless it is absolutely necessary and only with the approval of your health care provider.  Do not use hot tubs, steam rooms, or saunas.  Do not drink alcohol.  Do not use any products that contain nicotine  or tobacco, such as cigarettes and e-cigarettes. If you need help quitting, ask your health care provider.  Do not use any medicinal herbs or unprescribed drugs. These chemicals affect the formation and growth of the baby.  Do not douche or use tampons or scented sanitary pads.  Do not cross your legs for long periods of time.  To prepare for the arrival of your baby: ? Take prenatal classes to understand, practice, and ask questions about labor and delivery. ? Make a trial run to the hospital. ? Visit the hospital and tour the maternity area. ? Arrange for maternity or paternity leave through employers. ? Arrange for family and friends to take care of pets while you are in the hospital. ? Purchase a rear-facing car seat and make sure you know how to install it in your car. ? Pack your hospital bag. ? Prepare the baby's nursery. Make sure to remove all pillows and stuffed animals from the baby's crib to prevent suffocation.  Visit your dentist if you have not gone during your pregnancy. Use a soft toothbrush to brush your teeth and be gentle when you floss. Contact a health care provider if:  You are unsure if you are in labor or if your water has broken.  You become dizzy.  You have mild pelvic cramps, pelvic pressure, or nagging pain in your abdominal area.  You have lower back pain.  You have persistent nausea, vomiting, or diarrhea.  You have an unusual or bad smelling vaginal discharge.  You have pain when you urinate. Get help right away if:  Your water breaks before 37 weeks.  You have regular contractions less than 5 minutes apart before 37 weeks.  You have a fever.  You are leaking fluid from your vagina.  You have spotting or  bleeding from your vagina.  You have severe abdominal pain or cramping.  You have rapid weight loss or weight gain.  You have shortness of breath with chest pain.  You notice sudden or extreme swelling of your face, hands, ankles, feet, or legs.  Your baby makes fewer than 10 movements in 2 hours.  You have severe headaches that do not go away when you take medicine.  You have vision changes. Summary  The third trimester is from week 28 through week 40, months 7 through 9. The third trimester is a time when the unborn baby (fetus) is growing rapidly.  During the third trimester, your discomfort may increase as you and your baby continue to gain weight. You may have abdominal, leg, and back pain, sleeping problems, and an increased need to urinate.  During the third trimester your breasts will keep growing and they will continue to become tender. A yellow fluid (colostrum) may leak from your breasts. This is the first milk you are producing for your baby.  False labor is a condition in which you feel small, irregular tightenings of the muscles in the womb (contractions) that eventually go away. These are called Braxton Hicks contractions. Contractions may last for hours, days, or even weeks before true labor sets in.  Signs of labor can include: abdominal cramps; regular contractions that start at 10 minutes apart and become stronger and more frequent with time; watery or bloody mucus discharge that comes from the vagina; increased pelvic pressure and dull back pain; and leaking of amniotic fluid. This information is not intended to replace advice given to you by your health care provider. Make sure you discuss any questions you have  with your health care provider. Document Released: 04/07/2001 Document Revised: 09/19/2015 Document Reviewed: 06/14/2012 Elsevier Interactive Patient Education  2017 [employer].

## 2016-09-25 NOTE — Progress Notes (Signed)
Pt is here for a OB visit. Tdap, transfusion papers signed. C/o rapid heart beat and SOB.

## 2016-09-26 LAB — HEMOGLOBIN AND HEMATOCRIT, BLOOD
HEMOGLOBIN: 11.7 g/dL (ref 11.1–15.9)
Hematocrit: 34.3 % (ref 34.0–46.6)

## 2016-09-26 LAB — GLUCOSE, 1 HOUR GESTATIONAL: Gestational Diabetes Screen: 145 mg/dL — ABNORMAL HIGH (ref 65–139)

## 2016-09-26 NOTE — Progress Notes (Signed)
[name] doing well, reports rapid heart beat and SOB. Lungs CTAB, Heart RRR, HR: 90 bpm, O2 saturation: 98% room air. Schedule of childbirth classes given. Plans breastfeeding. Desires natural childbirth, immediate skin to skin, and delayed cord clamping. Unsure about PP contraception at this time. Encouraged birth plan and use of doula, if needed. TDaP given. Blood transfusion consent form reviewed and signed. Glucola and H/H today. Reviewed red flag symptoms and when to call. RTC x 2 weeks for ROB or sooner if needed.

## 2016-10-02 NOTE — Progress Notes (Signed)
Thank you I will schedule that appointment.

## 2016-10-09 ENCOUNTER — Other Ambulatory Visit: Payer: 59

## 2016-10-09 ENCOUNTER — Encounter: Payer: 59 | Admitting: Obstetrics and Gynecology

## 2016-10-09 ENCOUNTER — Ambulatory Visit (INDEPENDENT_AMBULATORY_CARE_PROVIDER_SITE_OTHER): Payer: 59 | Admitting: Certified Nurse Midwife

## 2016-10-09 VITALS — BP 103/68 | HR 97 | Wt 152.0 lb

## 2016-10-09 DIAGNOSIS — Z3403 Encounter for supervision of normal first pregnancy, third trimester: Secondary | ICD-10-CM

## 2016-10-09 LAB — POCT URINALYSIS DIPSTICK
Bilirubin, UA: NEGATIVE
Blood, UA: NEGATIVE
Glucose, UA: NEGATIVE
Ketones, UA: 160
LEUKOCYTES UA: NEGATIVE
NITRITE UA: NEGATIVE
PH UA: 6.5 (ref 5.0–8.0)
PROTEIN UA: NEGATIVE
Spec Grav, UA: 1.015 (ref 1.010–1.025)
UROBILINOGEN UA: 0.2 U/dL

## 2016-10-09 NOTE — Patient Instructions (Signed)
Immunizations and Pregnancy Immunizations, or vaccines, can help to keep you healthy. They can also protect your baby from some diseases until your baby is old enough to safely receive them. If you are pregnant or you are planning a pregnancy, the vaccines that you need are determined by:  Your age.  Your lifestyle.  Your medical history.  Your travel plans.  Your previous vaccines.  The benefits of receiving immunizations during pregnancy usually outweigh the risks:  When the risk of being exposed to a disease is high.  When infection would pose a risk to you or your unborn baby.  When the vaccine is not likely to cause harm.  Should I receive immunizations before pregnancy? If possible, make sure that your vaccines are up to date before you become pregnant. It is safe and important for you to receive weakened viral and weakened bacterial (inactivated) vaccines, as needed, before you are pregnant. Live viral and live bacterial (attenuated) vaccines should be given 1 month or more before pregnancy. Some examples of attenuated vaccines include:  Live attenuated influenza vaccine (LAIV).  Measles, mumps, and rubella (MMR).  Measles, mumps, rubella, and varicella (MMRV).  Rotavirus (RV5 or RV1).  Smallpox.  Typhoid (Ty21a, oral capsule form of the vaccine).  Varicella (VAR).  Shingles.  Yellow fever (YF).  If you become pregnant within 1 month after you have received an attenuated vaccine, contact your health care provider. Should I receive immunizations during pregnancy? It is safe and important for you to receive inactivated vaccines as needed during pregnancy. Until your baby can receive vaccines, your baby will get some protection from diseases through the vaccines that you receive while you are pregnant. However, some inactivated vaccines have not been thoroughly studied in pregnant women, and at this time, they are not recommended during pregnancy unless the benefits  outweigh the risks. One example is the pneumococcal polysaccharide vaccine (PPSV23). In addition, the human papillomavirus (HPV4 or HPV2) vaccine is not recommended during pregnancy. You should receive inactivated influenza (IIV) and adult tetanus, diphtheria, and acellular pertussis (Tdap) vaccines during your pregnancy. The IIV, which is known as "the flu shot," will protect you and your baby (up to 45 months of age) from some complications and strains of influenza. Pregnant women can receive IIV at any time and during any trimester. The Tdap vaccine will help to prevent whooping cough (pertussis) in you and your baby. You should receive 1 dose of this vaccine during each pregnancy. It is recommended that pregnant women receive this vaccine during the 27th-36th weeks of pregnancy. Usually, attenuated vaccines are not given to pregnant women. There is a possible risk of passing the vaccine virus or bacteria to the unborn baby. If you are pregnant and you received an attenuated vaccine, contact your health care provider. Should I receive immunizations after pregnancy? It is safe and important for you to receive vaccines as needed after pregnancy. This is true even if you are breastfeeding. If you did not receive the Tdap vaccine during your pregnancy, you should receive that vaccine right after you give birth to your baby (delivery). If you are not immune to measles, mumps, rubella, or varicella, you should receive the MMR or MMRV vaccine within days after delivery. Most other vaccines are also safe to receive after pregnancy. What if I am pregnant and I plan to travel internationally? If you are pregnant and you are planning to travel internationally, talk with your health care provider at least 4-6 weeks before your trip.  Discuss precautions or vaccine options. Before you receive vaccines, the risk of disease and immunization should always be determined. Immunizations that are recommended for pregnant  international travelers include:  Hepatitis B (HepB).  IIV.  Tetanus and diphtheria (Td) or Tdap.  Hepatitis A (HepA).  Immunizations that should be delayed or given only when benefits outweigh the risk of disease exposure for pregnant international travelers include:  Japanese encephalitis (JE).  Meningococcal meningitis (MPSV4 or MCV4).  PPSV23.  Inactivated polio (IPV).  Rabies.  Typhoid.  YF.  Immunizations that should not be given to pregnant international travelers include:  Tuberculosis (BCG).  MMR.  MMRV.  HPV4 or HPV2.  VAR.  LAIV.  This information is not intended to replace advice given to you by your health care provider. Make sure you discuss any questions you have with your health care provider. Document Released: 05/03/2007 Document Revised: 09/13/2015 Document Reviewed: 05/21/2014 Elsevier Interactive Patient Education  [address] [place] Class  October 07, 2016  Wednesday 7:00p - 9:00p  [facility] [place], [place]  November 11, 2016  Wednesday 7:00p - 9:00p [facility] [place], [place]    December 16, 2016   Wednesday 7:00p - 9:000p [facility] [place], [place]  January 13, 2017  Wednesday  7:00p - 9:00p [facility] [place], [place]  February 10, 2017 Wednesday 7:00p - 9:00p [facility] [place], [place]  Interested in a waterbirth?  This informational class will help you discover whether waterbirth is the right fit for you.  Education about waterbirth itself, supplies you would need and how to assemble your support team is what you can expect from this class.  Some obstetrical practices require this class in order to pursue a waterbirth.  (Not all obstetrical practices offer waterbirth check with your healthcare provider)  Register only the expectant mom, but you are encouraged to bring your partner to class!  Fees & Payment No  fee  Register Online [email or web address] Search [facility] 2018 Prenatal Education Class Schedule Register at [email or web address] in the Classes & Resources Link or call Mardi Mainland at [phone] 9:00a-5:00p M-F  Childbirth Preparation Certified Childbirth Educators teach this 5 week course.  Expectant parents are encouraged to take this class in their 3rd trimester, completing it by their 35-36th week. Meets in [facility], Lower Level.  Mondays Thursdays  7:00-9:00 p 7:00-9:00 p  July 23 - August 20 July 19 - August 16  September 17 - October 15 September 6 -October 4  November 5 - December 3 October 25 - November 29   No Class on Thanksgiving Day -November 22  Childbirth Preparation Refresher Course For those who have previously attended Prepared Childbirth Preparation classes, this class in incorporated into the 3rd and 4th classes in the Monday night childbirth series.  Course meets in the [facility]. Lower Level from 7:00p - 9:00p  August 6 & 13  October 1 & 8  November 19 & 26   Weekend Childbirth Aundria Mems Classes are held Saturday & Sunday, 1:00 5:00p Course meets in [facility] [place] Level  August 4 & 5  November 3 & 4    The [address] Free tours are held on the third Sunday of each month at 3 pm.  The tour meets in the third floor waiting area and will take approximately 30 minutes.  Tours are also included in Childbirth class series as well as Brother/Sister class.  An online  virtual tour can be seen at [email or web address].         Breastfeeding & Infant Nutrition The course incorporates returning to work or school.  Breast milk collection and storage with basic breastfeeding and infant nutrition. This two-class course is held the 2nd and 3rd Tuesday of each month from 7:00 -9:00 pm.  Course meets in the [facility] Lower level  June 12 & 19 July-No Class   August 14 & 21 September 11 &18  September 11 & 18 October 9 &16  November 13 & 20 December 11 & 18   [employer] welcomes any mother for a social outing with other Moms to share experiences and challenges in an informal setting.  Meets the 1st Thursday and 3rd Thursday 11:30a-1:00 pm of each month in [facility] 3rd floor classroom.  No registration required.  Newborn Essentials This course covers bathing, diapering, swaddling and more with practice on lifelike dolls.  Participants will also learn safety tips and infant CPR (Not for certification).  It is held the 1st Wednesday of each month from 7:00p-9:00p in the [facility], Lower level.  June 6 July- No Class  August 1 September 5  October 3 November 7  December 7    Preparing Big Brother & Sister This one session course prepares children and their parents for the arrival of a new baby.  It is held on the 1st Tuesday of each month from 6:30p - 8:00p. Course meets in the [facility], Lower level.  July-No Class August 7  September 4 October 2  November 6 December 4   [employer] Dads This nationally acclaimed class helps expecting and new dads with the basic skills and confidence to bond with their infants, support their mates, and provide a safe and healthy home environment for their new family. Classes are held the 2nd Saturday of every month from 9:00a - 12:00 noon.  Course meets in the [facility] Lower level.  June 9 August 11  October 13 No Class in December

## 2016-10-09 NOTE — Progress Notes (Signed)
[name] doing well, reports increased fatigue in third trimester. Denies difficulty breathing or respiratory distress. Encouraged rest, modified household activities, and light exercise including yoga and walking. Three (3) hour GTT today. Discussed vaccinations in pregnancy and postpartum period. [facility] Prenatal Education Class Schedule and [facility] Waterbirth Class Schedule given. Reviewed red flag symptoms and when to call. RTC x 2 weeks for ROB.

## 2016-10-10 LAB — GESTATIONAL GLUCOSE TOLERANCE
GLUCOSE 2 HOUR GTT: 189 mg/dL — AB (ref 65–154)
GLUCOSE 3 HOUR GTT: 174 mg/dL — AB (ref 65–139)
Glucose, Fasting: 58 mg/dL — ABNORMAL LOW (ref 65–94)
Glucose, GTT - 1 Hour: 182 mg/dL — ABNORMAL HIGH (ref 65–179)

## 2016-10-12 ENCOUNTER — Other Ambulatory Visit: Payer: Self-pay | Admitting: Certified Nurse Midwife

## 2016-10-12 DIAGNOSIS — O24419 Gestational diabetes mellitus in pregnancy, unspecified control: Secondary | ICD-10-CM | POA: Insufficient documentation

## 2016-10-20 ENCOUNTER — Encounter: Payer: Self-pay | Admitting: *Deleted

## 2016-10-20 ENCOUNTER — Encounter: Payer: 59 | Attending: Certified Nurse Midwife | Admitting: *Deleted

## 2016-10-20 VITALS — BP 90/62 | Ht 59.0 in | Wt 153.4 lb

## 2016-10-20 DIAGNOSIS — Z3A00 Weeks of gestation of pregnancy not specified: Secondary | ICD-10-CM | POA: Diagnosis not present

## 2016-10-20 DIAGNOSIS — O24419 Gestational diabetes mellitus in pregnancy, unspecified control: Secondary | ICD-10-CM | POA: Insufficient documentation

## 2016-10-20 DIAGNOSIS — O24410 Gestational diabetes mellitus in pregnancy, diet controlled: Secondary | ICD-10-CM

## 2016-10-20 NOTE — Progress Notes (Signed)
Diabetes Self-Management Education  Visit Type: First/Initial  Appt. Start Time: 0835 Appt. End Time: 1005  10/20/2016  [name], identified by name and date of birth, is a 26 y.o. female with a diagnosis of Diabetes: Gestational Diabetes.   ASSESSMENT  Blood pressure 90/62, height 4\' 11"  (1.499 m), weight 153 lb 6.4 oz (69.6 kg), last menstrual period 03/09/2016. Body mass index is 30.98 kg/m.      Diabetes Self-Management Education - 10/20/16 1013      Visit Information   Visit Type First/Initial     Initial Visit   Diabetes Type Gestational Diabetes   Are you currently following a meal plan? Yes   What type of meal plan do you follow? less carbs   Are you taking your medications as prescribed? Yes   Date Diagnosed this month     Health Coping   How would you rate your overall health? Good     Psychosocial Assessment   Patient Belief/Attitude about Diabetes Motivated to manage diabetes   Self-care barriers None   Self-management support Doctor's office;Family   Patient Concerns Nutrition/Meal planning;Other (comment)  Preventing Type 2 Diabetes   Special Needs None   Preferred Learning Style Visual;Auditory   Learning Readiness Change in progress   How often do you need to have someone help you when you read instructions, pamphlets, or other written materials from your doctor or pharmacy? 1 - Never   What is the last grade level you completed in school? 12th     Pre-Education Assessment   Patient understands the diabetes disease and treatment process. Needs Instruction   Patient understands incorporating nutritional management into lifestyle. Needs Instruction   Patient undertands incorporating physical activity into lifestyle. Needs Instruction   Patient understands using medications safely. Needs Instruction   Patient understands monitoring blood glucose, interpreting and using results Needs Instruction   Patient understands prevention, detection, and  treatment of acute complications. Needs Instruction   Patient understands prevention, detection, and treatment of chronic complications. Needs Instruction   Patient understands how to develop strategies to address psychosocial issues. Needs Instruction   Patient understands how to develop strategies to promote health/change behavior. Needs Instruction     Complications   How often do you check your blood sugar? 0 times/day (not testing)  Provided One Touch Verio Flex meter and instructed on use. BG upon return demonstration was 66 mg/dL at 9:55 am - 2 1/2 hrs pp. She denied symptoms of hypoglycemia. Gave her glucose tablets and she chewed 2 before she left to eat.    Have you had a dilated eye exam in the past 12 months? Yes   Have you had a dental exam in the past 12 months? Yes   Are you checking your feet? No     Dietary Intake   Breakfast oatmeal or 1/2 cup raspberries or strawberries with yogurt   Lunch chicken and cuccumbers, carrots, grapes   Dinner chicken, occasional beef, pork - potatoes, bread, pasta, rice   Beverage(s) water, coffee - 1 cup per day; occasional unsweetened tea and diet soda     Exercise   Exercise Type ADL's     Patient Education   Previous Diabetes Education No   Disease state  Definition of diabetes, type 1 and 2, and the diagnosis of diabetes;Factors that contribute to the development of diabetes   Nutrition management  Role of diet in the treatment of diabetes and the relationship between the three main macronutrients and blood glucose  level;Reviewed blood glucose goals for pre and post meals and how to evaluate the patients' food intake on their blood glucose level.   Physical activity and exercise  Role of exercise on diabetes management, blood pressure control and cardiac health.   Monitoring Taught/evaluated SMBG meter.;Purpose and frequency of SMBG.;Taught/discussed recording of test results and interpretation of SMBG.;Ketone testing, when, how.   Acute  complications Taught treatment of hypoglycemia - the 15 rule.   Chronic complications Relationship between chronic complications and blood glucose control   Psychosocial adjustment Identified and addressed patients feelings and concerns about diabetes   Preconception care Pregnancy and GDM  Role of pre-pregnancy blood glucose control on the development of the fetus;Reviewed with patient blood glucose goals with pregnancy;Role of family planning for patients with diabetes     Individualized Goals (developed by patient)   Reducing Risk Prevent diabetes in the future     Outcomes   Expected Outcomes Demonstrated interest in learning. Expect positive outcomes      Individualized Plan for Diabetes Self-Management Training:   Learning Objective:  Patient will have a greater understanding of diabetes self-management. Patient education plan is to attend individual and/or group sessions per assessed needs and concerns.   Plan:   Patient Instructions  Read booklet on Gestational Diabetes Follow Gestational Meal Planning Guidelines Avoid fruit for breakfast Complete a 3 Day Food Record and bring to next appointment Check blood sugars 4 x day - before breakfast and 2 hrs after every meal and record  Bring blood sugar log to all appointments Call MD for prescription for meter strips and lancets Strips   One Touch Verio   Lancets   One Touch Delica Purchase urine ketone strips if blood sugars not controlled and check urine ketones every am:  If + increase bedtime snack to 1 protein and 2 carbohydrate servings Walk 20-30 minutes at least 5 x week if permitted by MD   Expected Outcomes:  Demonstrated interest in learning. Expect positive outcomes  Education material provided:  Gestational Booklet Gestational Meal Planning Guidelines Simple Meal Plan Viewed Gestational Diabetes Video Meter = One Touch Verio Flex 3 Day Food Record Goals for a Healthy Pregnancy  If problems or questions,  patient to contact team via:  Johny Drilling, RN, [place], CDE [phone]  Future DSME appointment:  October 26, 2016 with dietitian

## 2016-10-20 NOTE — Patient Instructions (Signed)
Read booklet on Gestational Diabetes Follow Gestational Meal Planning Guidelines Avoid fruit for breakfast Complete a 3 Day Food Record and bring to next appointment Check blood sugars 4 x day - before breakfast and 2 hrs after every meal and record  Bring blood sugar log to all appointments Call MD for prescription for meter strips and lancets Strips   One Touch Verio Lancets   One Touch Delica Purchase urine ketone strips if blood sugars not controlled and check urine ketones every am:  If + increase bedtime snack to 1 protein and 2 carbohydrate servings Walk 20-30 minutes at least 5 x week if permitted by MD  

## 2016-10-23 ENCOUNTER — Telehealth: Payer: Self-pay | Admitting: Certified Nurse Midwife

## 2016-10-23 NOTE — Telephone Encounter (Signed)
Error

## 2016-10-26 ENCOUNTER — Ambulatory Visit: Payer: 59 | Admitting: Dietician

## 2016-10-27 ENCOUNTER — Ambulatory Visit (INDEPENDENT_AMBULATORY_CARE_PROVIDER_SITE_OTHER): Payer: 59 | Admitting: Obstetrics and Gynecology

## 2016-10-27 VITALS — BP 102/81 | HR 93 | Wt 154.6 lb

## 2016-10-27 DIAGNOSIS — Z3493 Encounter for supervision of normal pregnancy, unspecified, third trimester: Secondary | ICD-10-CM

## 2016-10-27 LAB — POCT URINALYSIS DIPSTICK
BILIRUBIN UA: NEGATIVE
Blood, UA: NEGATIVE
Glucose, UA: NEGATIVE
KETONES UA: NEGATIVE
LEUKOCYTES UA: NEGATIVE
Nitrite, UA: NEGATIVE
PH UA: 6.5 (ref 5.0–8.0)
PROTEIN UA: NEGATIVE
SPEC GRAV UA: 1.01 (ref 1.010–1.025)
Urobilinogen, UA: 0.2 E.U./dL

## 2016-10-27 NOTE — Progress Notes (Signed)
ROB-will do growth scan next visit.

## 2016-10-27 NOTE — Progress Notes (Signed)
ROB- pt is doing well, her BS is running low

## 2016-10-29 ENCOUNTER — Encounter: Payer: Self-pay | Admitting: Dietician

## 2016-10-29 ENCOUNTER — Encounter: Payer: 59 | Attending: Certified Nurse Midwife | Admitting: Dietician

## 2016-10-29 VITALS — BP 100/70 | Ht 59.0 in | Wt 154.3 lb

## 2016-10-29 DIAGNOSIS — O24410 Gestational diabetes mellitus in pregnancy, diet controlled: Secondary | ICD-10-CM

## 2016-10-29 DIAGNOSIS — Z3A00 Weeks of gestation of pregnancy not specified: Secondary | ICD-10-CM | POA: Insufficient documentation

## 2016-10-29 DIAGNOSIS — O24419 Gestational diabetes mellitus in pregnancy, unspecified control: Secondary | ICD-10-CM | POA: Diagnosis present

## 2016-10-29 NOTE — Patient Instructions (Signed)
   Continue with current healthy and balanced eating pattern.   Keep a snack on hand at all times in case of hunger, or delayed meal, to prevent low blood sugar.   If supper is more than 1 1/2-2 hours before bedtime, include a bedtime snack with carb and protein.

## 2016-10-29 NOTE — Progress Notes (Signed)
   Patient's BG record indicates BGs are within goal ranges.  Patient's food diary indicates balanced meals and generally healthy food choices. She is eating 1 snack daily, denies any symptoms of hypoglycemia. Did review proper treatment for hypoglycemia and encouraged patient to keep healthy snacks on hand in case needed.   Provided 1700kcal meal plan, and wrote individualized menus based on patient's food preferences.  Instructed patient on food safety, including avoidance of Listeriosis, and limiting mercury from fish.  Discussed importance of maintaining healthy lifestyle habits to reduce risk of Type 2 DM as well as Gestational DM with any future pregnancies.  Advised patient to use any remaining testing supplies to test some BGs after delivery, and to have BG tested ideally annually, as well as prior to attempting future pregnancies.

## 2016-11-06 ENCOUNTER — Ambulatory Visit (INDEPENDENT_AMBULATORY_CARE_PROVIDER_SITE_OTHER): Payer: 59

## 2016-11-06 ENCOUNTER — Ambulatory Visit (INDEPENDENT_AMBULATORY_CARE_PROVIDER_SITE_OTHER): Payer: 59 | Admitting: Certified Nurse Midwife

## 2016-11-06 ENCOUNTER — Encounter: Payer: Self-pay | Admitting: Certified Nurse Midwife

## 2016-11-06 VITALS — BP 105/68 | HR 71 | Wt 154.1 lb

## 2016-11-06 DIAGNOSIS — Z3493 Encounter for supervision of normal pregnancy, unspecified, third trimester: Secondary | ICD-10-CM | POA: Diagnosis not present

## 2016-11-06 LAB — POCT URINALYSIS DIPSTICK
Bilirubin, UA: NEGATIVE
Blood, UA: NEGATIVE
Glucose, Urine: NEGATIVE
Leukocytes, UA: NEGATIVE
Nitrite, UA: NEGATIVE
PROTEIN UA: NEGATIVE
Spec Grav, UA: 1.01 (ref 1.010–1.025)
Urobilinogen, UA: 0.2 E.U./dL
pH, UA: 7 (ref 5.0–8.0)

## 2016-11-06 NOTE — Progress Notes (Signed)
[name] doing well, reports abdominal discomfort due to fetal position. Encouraged patient to visit [email or web address] for optimization of fetal positioning activities. Growth [place] and AFI today, 65th percentile and AFI 13.9, findings reviewed with patient and spouse. Pt forgot blood sugar log, but reports results wnl. Advised pt to sent log via MyChart today. Pt has hemorrhoid, not using anything at this time. Discussed home treatment measures including the use of OTC medications. Anticipatory guidance regarding antenatal testing, course of prenatal care, 36 week cultures, and induction of labor. Reviewed red flag symptoms and when to call. RTC x 2 weeks for NST, 36 week cultures, and ROB.   ULTRASOUND REPORT  Location: [facility] Date of Service: 11/06/16  Indications:Growth and AFI Findings:  Alexis Snow intrauterine pregnancy is visualized with FHR at 139 BPM. Biometrics give an (U/S) Gestational age of 90 1/7weeks and an (U/S) EDD of 12/03/16; this correlates with the clinically established EDD of 12/14/16.  Fetal presentation is Vertex.  EFW: 2832g (6lb 4oz) Williams 65th percentile. Placenta: Anterior, grade 0, remote to cervix. AFI: 13.9 cm.  Fetal stomach and bladder are seen.  Impression: 1. 36 1/7 week Viable Singleton Intrauterine pregnancy by U/S. 2. ([place]) EDD is consistent with Clinically established (LMP) EDD of . 3. Normal Anatomy Scan  Recommendations: 1.Clinical correlation with the patient's History and Physical Exam.

## 2016-11-06 NOTE — Patient Instructions (Addendum)
Nonstress Test The nonstress test is a procedure that monitors the fetus's heartbeat. The test will monitor the heartbeat when the fetus is at rest and while the fetus is moving. In a healthy fetus, there will be an increase in fetal heart rate when the fetus moves or kicks. The heart rate will decrease at rest. This test helps determine if the fetus is healthy. Your health care provider will look at a number of patterns in the heart rate tracing to make sure your baby is thriving. If there is concern, your health care provider may order additional tests or may suggest another course of action. This test is often done in the third trimester and can help determine if an early delivery is needed and safe. Common reasons to have this test are:  You are past your due date.  You have a high-risk pregnancy.  You are feeling less movement than normal.  You have lost a pregnancy in the past.  Your health care provider suspects fetal growth problems.  You have too much or too little amniotic fluid.  What happens before the procedure?  Eat a meal right before the test or as directed by your health care provider. Food may help stimulate fetal movements.  Use the restroom right before the test. What happens during the procedure?  Two belts will be placed around your abdomen. These belts have monitors attached to them. One records the fetal heart rate and the other records uterine contractions.  You may be asked to lie down on your side or to stay sitting upright.  You may be given a button to press when you feel movement.  The fetal heartbeat is listened to and watched on a screen. The heartbeat is recorded on a sheet of paper.  If the fetus seems to be sleeping, you may be asked to drink some juice or soda, gently press your abdomen, or make some noise to wake the fetus. What happens after the procedure? Your health care provider will discuss the test results with you and make recommendations  for the near future.  This information is not intended to replace advice given to you by your health care provider. Make sure you discuss any questions you have with your health care provider. This information is not intended to replace advice given to you by your health care provider. Make sure you discuss any questions you have with your health care provider. Document Released: 04/03/2002 Document Revised: 03/13/2016 Document Reviewed: 05/17/2012 Elsevier Interactive Patient Education  2018 [employer]. Common Medications Safe in Pregnancy  Acne:      Constipation:  Benzoyl Peroxide     Colace  Clindamycin      Dulcolax Suppository  Topica Erythromycin     Fibercon  Salicylic Acid      Metamucil         Miralax AVOID:        Senakot   Accutane    Cough:  Retin-A       Cough Drops  Tetracycline      Phenergan w/ Codeine if Rx  Minocycline      Robitussin (Plain & DM)  Antibiotics:     Crabs/Lice:  Ceclor       RID  Cephalosporins    AVOID:  E-Mycins      Kwell  Keflex  Macrobid/Macrodantin   Diarrhea:  Penicillin      Kao-Pectate  Zithromax      Imodium AD  PUSH FLUIDS AVOID:       Cipro     Fever:  Tetracycline      Tylenol (Regular or Extra  Minocycline       Strength)  Levaquin      Extra Strength-Do not          Exceed 8 tabs/24 hrs Caffeine:        '200mg'$ /day (equiv. To 1 cup of coffee or  approx. 3 12 oz sodas)         Gas: Cold/Hayfever:       Gas-X  Benadryl      Mylicon  Claritin       Phazyme  **Claritin-D        Chlor-Trimeton    Headaches:  Dimetapp      ASA-Free Excedrin  Drixoral-Non-Drowsy     Cold Compress  Mucinex (Guaifenasin)     Tylenol (Regular or Extra  Sudafed/Sudafed-12 Hour     Strength)  **Sudafed PE Pseudoephedrine   Tylenol Cold & Sinus     Vicks Vapor Rub  Zyrtec  **AVOID if Problems With Blood Pressure         Heartburn: Avoid lying down for at least 1 hour after meals  Aciphex      Maalox     Rash:  Milk of  Magnesia     Benadryl    Mylanta       1% Hydrocortisone Cream  Pepcid  Pepcid Complete   Sleep Aids:  Prevacid      Ambien   Prilosec       Benadryl  Rolaids       Chamomile Tea  Tums (Limit 4/day)     Unisom  Zantac       Tylenol PM         Warm milk-add vanilla or  Hemorrhoids:       Sugar for taste  Anusol/Anusol H.C.  (RX: Analapram 2.5%)  Sugar Substitutes:  Hydrocortisone OTC     Ok in moderation  Preparation H      Tucks        Vaseline lotion applied to tissue with wiping    Herpes:     Throat:  Acyclovir      Oragel  Famvir  Valtrex     Vaccines:         Flu Shot Leg Cramps:       *Gardasil  Benadryl      Hepatitis A         Hepatitis B Nasal Spray:       Pneumovax  Saline Nasal Spray     Polio Booster         Tetanus Nausea:       Tuberculosis test or PPD  Vitamin B6 25 mg TID   AVOID:    Dramamine      *Gardasil  Emetrol       Live Poliovirus  Ginger Root 250 mg QID    MMR (measles, mumps &  High Complex Carbs @ Bedtime    rebella)  Sea Bands-Accupressure    Varicella (Chickenpox)  Unisom 1/2 tab TID     *No known complications           If received before Pain:         Known pregnancy;   Darvocet       Resume series after  Lortab        Delivery  Percocet    Yeast:   Tramadol  Femstat  Tylenol 3      Gyne-lotrimin  Ultram       Monistat  Vicodin           MISC:         All Sunscreens           Hair Coloring/highlights          Insect Repellant's          (Including DEET)         Mystic Tans  

## 2016-11-17 ENCOUNTER — Ambulatory Visit (INDEPENDENT_AMBULATORY_CARE_PROVIDER_SITE_OTHER): Payer: 59 | Admitting: Obstetrics and Gynecology

## 2016-11-17 ENCOUNTER — Other Ambulatory Visit: Payer: 59

## 2016-11-17 VITALS — BP 99/72 | HR 98 | Wt 158.6 lb

## 2016-11-17 DIAGNOSIS — Z3493 Encounter for supervision of normal pregnancy, unspecified, third trimester: Secondary | ICD-10-CM | POA: Diagnosis not present

## 2016-11-17 DIAGNOSIS — Z3685 Encounter for antenatal screening for Streptococcus B: Secondary | ICD-10-CM

## 2016-11-17 DIAGNOSIS — Z113 Encounter for screening for infections with a predominantly sexual mode of transmission: Secondary | ICD-10-CM

## 2016-11-17 LAB — POCT URINALYSIS DIPSTICK
Bilirubin, UA: NEGATIVE
Glucose, UA: NEGATIVE
KETONES UA: NEGATIVE
Leukocytes, UA: NEGATIVE
Nitrite, UA: NEGATIVE
PH UA: 6 (ref 5.0–8.0)
PROTEIN UA: NEGATIVE
RBC UA: NEGATIVE
SPEC GRAV UA: 1.01 (ref 1.010–1.025)
Urobilinogen, UA: 0.2 E.U./dL

## 2016-11-17 NOTE — Progress Notes (Signed)
ROB & NST done, cultures obtained

## 2016-11-17 NOTE — Progress Notes (Signed)
ROB- NST performed today was reviewed and was found to be reactive. Baseline 135 with Moderate variability; No decels noted.  Continue recommended antenatal testing and prenatal care.FBS all below 80 and PP all but two <100. OK to switch to twice a day testing. No need for more NST at this time, will repeat growth scan in 38th week, and plan IOL on August 14th at 5am.

## 2016-11-19 LAB — GC/CHLAMYDIA PROBE AMP
CHLAMYDIA, DNA PROBE: NEGATIVE
NEISSERIA GONORRHOEAE BY PCR: NEGATIVE

## 2016-11-19 LAB — STREP GP B NAA: Strep Gp B NAA: NEGATIVE

## 2016-11-26 ENCOUNTER — Ambulatory Visit (INDEPENDENT_AMBULATORY_CARE_PROVIDER_SITE_OTHER): Payer: 59 | Admitting: Certified Nurse Midwife

## 2016-11-26 VITALS — BP 107/77 | HR 82 | Wt 156.7 lb

## 2016-11-26 DIAGNOSIS — Z3493 Encounter for supervision of normal pregnancy, unspecified, third trimester: Secondary | ICD-10-CM

## 2016-11-26 LAB — POCT URINALYSIS DIPSTICK
Bilirubin, UA: NEGATIVE
Blood, UA: NEGATIVE
Glucose, UA: NEGATIVE
Ketones, UA: NEGATIVE
LEUKOCYTES UA: NEGATIVE
NITRITE UA: NEGATIVE
PH UA: 6.5 (ref 5.0–8.0)
PROTEIN UA: NEGATIVE
Spec Grav, UA: 1.01 (ref 1.010–1.025)
Urobilinogen, UA: 0.2 E.U./dL

## 2016-11-26 NOTE — Patient Instructions (Addendum)
Fetal Movement Counts  Patient Name: ________________________________________________ Patient Due Date: ____________________  What is a fetal movement count?  A fetal movement count is the number of times that you feel your baby move during a certain amount of time. This may also be called a fetal kick count. A fetal movement count is recommended for every pregnant woman. You may be asked to start counting fetal movements as early as week 28 of your pregnancy.  Pay attention to when your baby is most active. You may notice your baby's sleep and wake cycles. You may also notice things that make your baby move more. You should do a fetal movement count:   When your baby is normally most active.   At the same time each day.    A good time to count movements is while you are resting, after having something to eat and drink.  How do I count fetal movements?  1. Find a quiet, comfortable area. Sit, or lie down on your side.  2. Write down the date, the start time and stop time, and the number of movements that you felt between those two times. Take this information with you to your health care visits.  3. For 2 hours, count kicks, flutters, swishes, rolls, and jabs. You should feel at least 10 movements during 2 hours.  4. You may stop counting after you have felt 10 movements.  5. If you do not feel 10 movements in 2 hours, have something to eat and drink. Then, keep resting and counting for 1 hour. If you feel at least 4 movements during that hour, you may stop counting.  Contact a health care provider if:   You feel fewer than 4 movements in 2 hours.   Your baby is not moving like he or she usually does.  Date: ____________ Start time: ____________ Stop time: ____________ Movements: ____________  Date: ____________ Start time: ____________ Stop time: ____________ Movements: ____________  Date: ____________ Start time: ____________ Stop time: ____________ Movements: ____________  Date: ____________ Start time:  ____________ Stop time: ____________ Movements: ____________  Date: ____________ Start time: ____________ Stop time: ____________ Movements: ____________  Date: ____________ Start time: ____________ Stop time: ____________ Movements: ____________  Date: ____________ Start time: ____________ Stop time: ____________ Movements: ____________  Date: ____________ Start time: ____________ Stop time: ____________ Movements: ____________  Date: ____________ Start time: ____________ Stop time: ____________ Movements: ____________  This information is not intended to replace advice given to you by your health care provider. Make sure you discuss any questions you have with your health care provider.  Document Released: 05/13/2006 Document Revised: 12/11/2015 Document Reviewed: 05/23/2015  Elsevier Interactive Patient Education  2018 [employer]  Augmentation of Labor  Augmentation of labor is when steps are taken to stimulate and strengthen uterine contractions during labor. This may be done when the contractions have slowed down or stopped, delaying progress of labor and delivery of the baby. Before beginning augmentation of labor, the health care provider will evaluate the condition of the mother and baby, the size and position of the baby, and the size of the birth canal.  What are the reasons for labor augmentation?  Reasons for augmentation of labor include:   Slow labor (prolonged first and second stage of labor) that has been associated with increased maternal risks, such as chorioamnionitis, postpartum hemorrhage, operative vaginal delivery, or third-degree or fourth-degree perineal lacerations.   Decreased average length of labor.    What methods are used for labor augmentation?    Various methods may be used for augmentation of labor, including:   Oxytocin medicine. This medicine stimulates contractions. It is given through an IV access tube inserted into a vein.   Breaking the fluid-filled sac that surrounds  the fetus (amniotic sac).   Stripping the membranes. The health care provider separates amniotic sac tissue from the cervix, causing the release of a hormone called progesterone that can stimulate uterine contractions.   Nipple stimulation.   Stimulation of certain pressure points on the ankles.   Manual or mechanical dilation of the cervix.    What are the risks associated with labor augmentation?   Overstimulation of the uterine contractions (continuous, prolonged, very strong contractions), causing fetal distress.   Increased chance of infection for the mother and baby.   Uterine tearing (rupture).   Breaking off (abruption) of the placenta.   Increased chance of cesarean, forceps, or vacuum delivery.  What are some reasons for not doing labor augmentation?  Augmentation of labor should not be done if:   The baby is too big for the birth canal. This can be confirmed by ultrasonography.   The umbilical cord drops in front of the baby's head or breech part (prolapsed cord).   The mother had a previous cesarean delivery with a vertical incision in the uterus (or the kind of incision used is not known). High dose oxytocin should not be used if the mother had a previous cesarean delivery of any kind.   The mother had previous surgery on or into the uterus.   The mother has herpes.   The mother has cervical cancer.   The baby is lying sideways.   The mother's pelvis is deformed.   The mother is pregnant with more than two babies.    This information is not intended to replace advice given to you by your health care provider. Make sure you discuss any questions you have with your health care provider.  Document Released: 10/06/2006 Document Revised: 09/25/2015 Document Reviewed: 11/10/2012  Elsevier Interactive Patient Education  2017 Elsevier Inc[employer]

## 2016-11-30 NOTE — Progress Notes (Signed)
Alexis Snow-Pt doing well. All blood sugars within normal limits. Discussed IOL methods including IV pitocin, Cytotec, nipple stimulation, and pitocin. Requests Michelle for IOL on 12/08/16 at 56. Reviewed home labor preparation methods including use of evening primrose oil, medjool dates, birthing ball, and walking. Education regarding baby blues, postpartum depression, and postpartum psychosis. Continue twice daily kick counts. RTC x 1 weeks for Growth [place] and Alexis Snow.

## 2016-12-04 ENCOUNTER — Ambulatory Visit (INDEPENDENT_AMBULATORY_CARE_PROVIDER_SITE_OTHER): Payer: 59 | Admitting: Obstetrics and Gynecology

## 2016-12-04 ENCOUNTER — Encounter: Payer: Self-pay | Admitting: Certified Nurse Midwife

## 2016-12-04 ENCOUNTER — Ambulatory Visit (INDEPENDENT_AMBULATORY_CARE_PROVIDER_SITE_OTHER): Payer: 59

## 2016-12-04 VITALS — BP 106/73 | HR 84 | Wt 158.1 lb

## 2016-12-04 DIAGNOSIS — Z3493 Encounter for supervision of normal pregnancy, unspecified, third trimester: Secondary | ICD-10-CM

## 2016-12-04 DIAGNOSIS — Z3403 Encounter for supervision of normal first pregnancy, third trimester: Secondary | ICD-10-CM

## 2016-12-04 LAB — POCT URINALYSIS DIPSTICK
Bilirubin, UA: NEGATIVE
GLUCOSE UA: NEGATIVE
KETONES UA: NEGATIVE
LEUKOCYTES UA: NEGATIVE
Nitrite, UA: NEGATIVE
PH UA: 6 (ref 5.0–8.0)
Protein, UA: NEGATIVE
RBC UA: NEGATIVE
Spec Grav, UA: 1.01 (ref 1.010–1.025)
Urobilinogen, UA: 0.2 E.U./dL

## 2016-12-04 NOTE — Progress Notes (Signed)
ROB and growth scan;  Date of Service: 12/04/16  Indications: EFW and AFI Findings:  Singleton intrauterine pregnancy is visualized with FHR at 140 BPM. Biometrics give an (U/S) Gestational age of [redacted] weeks 1 day, and an (U/S) EDD of 12/10/16; this correlates with the clinically established EDD of 12/14/16.  Fetal presentation is vertex, spine right lateral.  EFW: 3784 grams ( 8 lbs. 5 oz.) 80th percentile for growth, Williams. Placenta: Anterior, grade 3 (heavily calcified). AFI: Adequate at 10.5 cm.  Anatomic survey of the fetal stomach, bladder and lateral ventricle appear WNL. Gender - Female.  BPP: 8/8, good fetal breathing, movement, tone and AFI   .  Impression: 1. 39 week 1 day Viable Singleton Intrauterine pregnancy by U/S. 2. (U/S) EDD is consistent with Clinically established (LMP) EDD of 12/14/16. 3. EFW: 3784 grams ( 8 lbs. 5 oz.) 80th percentile for growth, Williams.

## 2016-12-08 ENCOUNTER — Inpatient Hospital Stay: Payer: 59 | Admitting: Anesthesiology

## 2016-12-08 ENCOUNTER — Inpatient Hospital Stay
Admission: EM | Admit: 2016-12-08 | Discharge: 2016-12-11 | DRG: 766 | Disposition: A | Discharge status: Home / Self Care | Payer: 59 | Attending: Certified Nurse Midwife | Admitting: Certified Nurse Midwife

## 2016-12-08 DIAGNOSIS — Z6790 Unspecified blood type, Rh positive: Secondary | ICD-10-CM | POA: Diagnosis not present

## 2016-12-08 DIAGNOSIS — O24420 Gestational diabetes mellitus in childbirth, diet controlled: Principal | ICD-10-CM | POA: Diagnosis present

## 2016-12-08 DIAGNOSIS — Z2839 Other underimmunization status: Secondary | ICD-10-CM

## 2016-12-08 DIAGNOSIS — O480 Post-term pregnancy: Secondary | ICD-10-CM | POA: Diagnosis not present

## 2016-12-08 DIAGNOSIS — O24419 Gestational diabetes mellitus in pregnancy, unspecified control: Secondary | ICD-10-CM

## 2016-12-08 DIAGNOSIS — O26893 Other specified pregnancy related conditions, third trimester: Secondary | ICD-10-CM | POA: Diagnosis present

## 2016-12-08 DIAGNOSIS — Z3490 Encounter for supervision of normal pregnancy, unspecified, unspecified trimester: Secondary | ICD-10-CM | POA: Diagnosis present

## 2016-12-08 DIAGNOSIS — Z3A39 39 weeks gestation of pregnancy: Secondary | ICD-10-CM

## 2016-12-08 DIAGNOSIS — O9081 Anemia of the puerperium: Secondary | ICD-10-CM | POA: Diagnosis not present

## 2016-12-08 DIAGNOSIS — O9989 Other specified diseases and conditions complicating pregnancy, childbirth and the puerperium: Secondary | ICD-10-CM

## 2016-12-08 DIAGNOSIS — Z98891 History of uterine scar from previous surgery: Secondary | ICD-10-CM

## 2016-12-08 DIAGNOSIS — Z283 Underimmunization status: Secondary | ICD-10-CM

## 2016-12-08 LAB — GLUCOSE, CAPILLARY: Glucose-Capillary: 82 mg/dL (ref 65–99)

## 2016-12-08 LAB — CBC
HCT: 37.4 % (ref 35.0–47.0)
HEMOGLOBIN: 13 g/dL (ref 12.0–16.0)
MCH: 34.6 pg — AB (ref 26.0–34.0)
MCHC: 34.6 g/dL (ref 32.0–36.0)
MCV: 99.9 fL (ref 80.0–100.0)
Platelets: 180 10*3/uL (ref 150–440)
RBC: 3.75 MIL/uL — ABNORMAL LOW (ref 3.80–5.20)
RDW: 13.5 % (ref 11.5–14.5)
WBC: 8.1 10*3/uL (ref 3.6–11.0)

## 2016-12-08 LAB — COMPREHENSIVE METABOLIC PANEL
ALBUMIN: 3.3 g/dL — AB (ref 3.5–5.0)
ALT: 19 U/L (ref 14–54)
ANION GAP: 8 (ref 5–15)
AST: 24 U/L (ref 15–41)
Alkaline Phosphatase: 216 U/L — ABNORMAL HIGH (ref 38–126)
BUN: 11 mg/dL (ref 6–20)
CHLORIDE: 109 mmol/L (ref 101–111)
CO2: 21 mmol/L — AB (ref 22–32)
Calcium: 8.7 mg/dL — ABNORMAL LOW (ref 8.9–10.3)
Creatinine, Ser: 0.55 mg/dL (ref 0.44–1.00)
GFR calc Af Amer: >60 mL/min (ref >60)
GFR calc non Af Amer: >60 mL/min (ref >60)
GLUCOSE: 75 mg/dL (ref 65–99)
POTASSIUM: 3.6 mmol/L (ref 3.5–5.1)
SODIUM: 138 mmol/L (ref 135–145)
Total Bilirubin: 0.5 mg/dL (ref 0.3–1.2)
Total Protein: 6.4 g/dL — ABNORMAL LOW (ref 6.5–8.1)

## 2016-12-08 LAB — TYPE AND SCREEN
ABO/RH(D): A POS
ANTIBODY SCREEN: NEGATIVE

## 2016-12-08 MED ORDER — BUTORPHANOL TARTRATE 1 MG/ML IJ SOLN: 1.0000 mg | INTRAMUSCULAR | Status: DC | PRN

## 2016-12-08 MED ORDER — OXYCODONE-ACETAMINOPHEN 5-325 MG PO TABS: 1.0000 | ORAL_TABLET | ORAL | Status: DC | PRN

## 2016-12-08 MED ORDER — EPHEDRINE 5 MG/ML INJ: 10.0000 mg | INTRAVENOUS | Status: DC | PRN

## 2016-12-08 MED ORDER — OXYTOCIN 40 UNITS IN LACTATED RINGERS INFUSION - SIMPLE MED: 1.0000 m[IU]/min | INTRAVENOUS | Status: DC

## 2016-12-08 MED ORDER — ACETAMINOPHEN 325 MG PO TABS: 650.0000 mg | ORAL_TABLET | ORAL | Status: DC | PRN

## 2016-12-08 MED ORDER — PHENYLEPHRINE 40 MCG/ML (10ML) SYRINGE FOR IV PUSH (FOR BLOOD PRESSURE SUPPORT): 80.0000 ug | PREFILLED_SYRINGE | INTRAVENOUS | Status: DC | PRN

## 2016-12-08 MED ORDER — FLEET ENEMA 7-19 GM/118ML RE ENEM: 1.0000 | ENEMA | Freq: Every day | RECTAL | Status: DC | PRN

## 2016-12-08 MED ORDER — SODIUM CHLORIDE 0.9 % IV SOLN
INTRAVENOUS | Status: DC | PRN
  Administered 2016-12-08 (×3): 5 mL via EPIDURAL

## 2016-12-08 MED ORDER — OXYTOCIN 40 UNITS IN LACTATED RINGERS INFUSION - SIMPLE MED
2.5000 [IU]/h | INTRAVENOUS | Status: DC
  Filled 2016-12-08: qty 1000

## 2016-12-08 MED ORDER — DIPHENHYDRAMINE HCL 50 MG/ML IJ SOLN: 12.5000 mg | INTRAMUSCULAR | Status: DC | PRN

## 2016-12-08 MED ORDER — LIDOCAINE-EPINEPHRINE (PF) 1.5 %-1:200000 IJ SOLN
INTRAMUSCULAR | Status: DC | PRN
  Administered 2016-12-08: 3 mL

## 2016-12-08 MED ORDER — LACTATED RINGERS IV SOLN
500.0000 mL | INTRAVENOUS | Status: DC | PRN
  Administered 2016-12-09: 1000 mL via INTRAVENOUS

## 2016-12-08 MED ORDER — LACTATED RINGERS IV SOLN
500.0000 mL | Freq: Once | INTRAVENOUS | Status: AC
  Administered 2016-12-08: 500 mL via INTRAVENOUS

## 2016-12-08 MED ORDER — STERILE WATER FOR INJECTION IJ SOLN
INTRAMUSCULAR | Status: AC
  Filled 2016-12-08: qty 50

## 2016-12-08 MED ORDER — MISOPROSTOL 25 MCG QUARTER TABLET
50.0000 ug | ORAL_TABLET | ORAL | Status: DC | PRN
  Filled 2016-12-08: qty 2

## 2016-12-08 MED ORDER — SOD CITRATE-CITRIC ACID 500-334 MG/5ML PO SOLN
30.0000 mL | ORAL | Status: DC | PRN
  Administered 2016-12-09: 30 mL via ORAL

## 2016-12-08 MED ORDER — LACTATED RINGERS IV SOLN
INTRAVENOUS | Status: DC
  Administered 2016-12-08 (×2): via INTRAVENOUS

## 2016-12-08 MED ORDER — OXYCODONE-ACETAMINOPHEN 5-325 MG PO TABS: 2.0000 | ORAL_TABLET | ORAL | Status: DC | PRN

## 2016-12-08 MED ORDER — ONDANSETRON HCL 4 MG/2ML IJ SOLN: 4.0000 mg | Freq: Four times a day (QID) | INTRAMUSCULAR | Status: DC | PRN

## 2016-12-08 MED ORDER — TERBUTALINE SULFATE 1 MG/ML IJ SOLN
0.2500 mg | Freq: Once | INTRAMUSCULAR | Status: AC | PRN
  Administered 2016-12-08: 0.25 mg via SUBCUTANEOUS
  Filled 2016-12-08: qty 1

## 2016-12-08 MED ORDER — MISOPROSTOL 25 MCG QUARTER TABLET
50.0000 ug | ORAL_TABLET | ORAL | Status: DC | PRN
  Administered 2016-12-08 (×3): 50 ug via ORAL
  Filled 2016-12-08 (×2): qty 2

## 2016-12-08 MED ORDER — OXYTOCIN BOLUS FROM INFUSION: 500.0000 mL | Freq: Once | INTRAVENOUS | Status: DC

## 2016-12-08 MED ORDER — ZOLPIDEM TARTRATE 5 MG PO TABS: 5.0000 mg | ORAL_TABLET | Freq: Every evening | ORAL | Status: DC | PRN

## 2016-12-08 MED ORDER — FENTANYL 2.5 MCG/ML W/ROPIVACAINE 0.15% IN NS 100 ML EPIDURAL (ARMC)
EPIDURAL | Status: AC
  Filled 2016-12-08: qty 100

## 2016-12-08 MED ORDER — LIDOCAINE HCL (PF) 1 % IJ SOLN: 30.0000 mL | INTRAMUSCULAR | Status: DC | PRN

## 2016-12-08 MED ORDER — FENTANYL 2.5 MCG/ML W/ROPIVACAINE 0.15% IN NS 100 ML EPIDURAL (ARMC)
12.0000 mL/h | EPIDURAL | Status: DC
  Administered 2016-12-08 – 2016-12-09 (×3): 12 mL/h via EPIDURAL
  Filled 2016-12-08 (×3): qty 100

## 2016-12-08 NOTE — Anesthesia Procedure Notes (Signed)
Epidural Patient location during procedure: OB Start time: 12/08/2016 4:11 PM End time: 12/08/2016 4:30 PM  Staffing Anesthesiologist: Emmie Niemann Performed: anesthesiologist   Preanesthetic Checklist Completed: patient identified, site marked, surgical consent, pre-op evaluation, timeout performed, IV checked, risks and benefits discussed and monitors and equipment checked  Epidural Patient position: sitting Prep: ChloraPrep Patient monitoring: heart rate, continuous pulse ox and blood pressure Approach: midline Location: L3-L4 Injection technique: LOR saline  Needle:  Needle type: Tuohy  Needle gauge: 18 G Needle length: 9 cm and 9 Needle insertion depth: 5 cm Catheter type: closed end flexible Catheter size: 20 Guage Catheter at skin depth: 9 cm Test dose: negative (0.125% bupivacaine)  Assessment Events: blood not aspirated, injection not painful, no injection resistance, negative IV test and no paresthesia  Additional Notes   Patient tolerated the insertion well without complications.Reason for block:procedure for pain

## 2016-12-08 NOTE — Progress Notes (Addendum)
Alexis Snow is a 26 y.o. G2P0010 at [redacted]w[redacted]d by first trimester ultrasound admitted for induction of labor due to Gestational diabetes-diet controlled.  Subjective:  Pt sitting quietly in bed, reports mild contractions.   Denies difficulty breathing or respiratory distress, chest pain, abdominal pain, vaginal bleeding, leakage of fluid, and leg pain or swelling.   Objective:  Temp:  [98.1 F (36.7 C)-99 F (37.2 C)] 98.1 F (36.7 C) (08/14 1252) Pulse Rate:  [74-94] 74 (08/14 1252) Resp:  [18] 18 (08/14 0530) BP: (111-119)/(76-80) 113/76 (08/14 1252) Weight:  [158 lb (71.7 kg)] 158 lb (71.7 kg) (08/14 0530)   Intake/Output Summary (Last 24 hours) at 12/08/16 1533 Last data filed at 12/08/16 1006  Gross per 24 hour  Intake           511.25 ml  Output                0 ml  Net           511.25 ml    FHT:  FHR: 135 bpm, variability: moderate,  accelerations:  Present,  decelerations:  Absent  UC:   regular, every two (2) to five (5) minutes with soft resting tone  SVE:   Dilation: 1.5 Effacement (%): 70 Station: -2 Exam by:: Robin Searing RN  Labs: Lab Results  Component Value Date   WBC 8.1 12/08/2016   HGB 13.0 12/08/2016   HCT 37.4 12/08/2016   MCV 99.9 12/08/2016   PLT 180 12/08/2016    Assessment:  Alexis Snow is a 26 y.o. G2P0010 at [redacted]w[redacted]d being admitted for induction of labor due to gestational diabetes-diet controlled, Rh positive, GBS negative, Rubella non-immune  FHR Category I  Plan:  Foley bulb inserted without difficulty.   Continue orders as written.  Reassess as needed.    Diona Fanti, CNM 12/08/2016, 1314

## 2016-12-08 NOTE — Progress Notes (Addendum)
Patient ID: Alexis Snow, female   DOB: [birth year], 26 y.o.   MRN: [id number]  Alexis Snow is a 26 y.o. G2P0010 at [redacted]w[redacted]d by first trimester ultrasound admitted for induction of labor due to Gestational diabetes-diet controlled.  Subjective:  Pt resting quietly in bed with eyes, comfortable with epidural.   Denies difficulty breathing or respiratory distress, chest pain, abdominal pain, vaginal bleeding, and leg pain or swelling.   Objective:  Temp:  [98.1 F (36.7 C)-99 F (37.2 C)] 98.1 F (36.7 C) (08/14 1252) Pulse Rate:  [70-96] 70 (08/14 1815) Resp:  [18] 18 (08/14 0530) BP: (92-125)/(54-80) 106/68 (08/14 1815) SpO2:  [96 %-100 %] 97 % (08/14 1835) Weight:  [158 lb (71.7 kg)] 158 lb (71.7 kg) (08/14 0530)   Intake/Output Summary (Last 24 hours) at 12/08/16 1859 Last data filed at 12/08/16 1006  Gross per 24 hour  Intake           511.25 ml  Output                0 ml  Net           511.25 ml    FHT:  FHR: 140 bpm, variability: moderate,  accelerations:  Present,  decelerations:  Present intermittent variable and late decelerations that resolve with hydration and repositioning.   UC:   regular, every three (3) to six (6) minutes with soft resting tone  SVE:   Dilation: 1.5 Effacement (%): 70 Station: -2 Exam by:: Robin Searing RN  Labs: Lab Results  Component Value Date   WBC 8.1 12/08/2016   HGB 13.0 12/08/2016   HCT 37.4 12/08/2016   MCV 99.9 12/08/2016   PLT 180 12/08/2016    Assessment:  Alexis Snow is a 26 y.o. G2P0010 at [redacted]w[redacted]d being admitted for induction of labor due to gestational diabetes-diet controlled, Rh positive, GBS negative, Rubella non-immune, epidural  FHR Category II  Plan:  Patient comfortable with epidural.   Decelerations resolved with position change. Will continue to monitor tracing.   Continue orders as written.  Reassess as needed.   Dr. Enzo Bi updated on FHR and plan of care.    Diona Fanti,  [place] 12/08/2016, [phone]

## 2016-12-08 NOTE — Progress Notes (Signed)
Patient ID: Alexis Snow, female   DOB: 1991-02-19, 26 y.o.   MRN: [id number]  Alexis Snow is a 26 y.o. G2P0010 at [redacted]w[redacted]d by first trimester ultrasound admitted for induction of labor due to Gestational diabetes-diet controlled.  Subjective:  Pt resting quietly in bed with eyes, comfortable with epidural.   Denies difficulty breathing or respiratory distress, chest pain, abdominal pain, vaginal bleeding, and leg pain or swelling.   Objective:  Temp:  [98.1 F (36.7 C)-99 F (37.2 C)] 98.3 F (36.8 C) (08/14 1915) Pulse Rate:  [62-122] 122 (08/14 2316) Resp:  [16-18] 16 (08/14 1915) BP: (92-125)/(54-90) 124/74 (08/14 2316) SpO2:  [95 %-100 %] 97 % (08/14 2315) Weight:  [158 lb (71.7 kg)] 158 lb (71.7 kg) (08/14 0530)   Intake/Output Summary (Last 24 hours) at 12/08/16 2346 Last data filed at 12/08/16 1006  Gross per 24 hour  Intake           511.25 ml  Output                0 ml  Net           511.25 ml    FHT:  FHR: 145 bpm, variability: moderate,  accelerations:  Present,  decelerations:  Present intermittent late decelerations that resolve with hydration, single dose of terb and repositioning.   UC:  regular, every six (6) to eight (8) minutes with soft resting tone  SVE:   Dilation: 5 Effacement (%): 80 Station: -2 Exam by:: ML  Labs: Lab Results  Component Value Date   WBC 8.1 12/08/2016   HGB 13.0 12/08/2016   HCT 37.4 12/08/2016   MCV 99.9 12/08/2016   PLT 180 12/08/2016    Assessment:  Alexis Snow is a 26 y.o. G2P0010 at [redacted]w[redacted]d being admitted for induction of labor due to gestational diabetes-diet controlled, Rh positive, GBS negative, Rubella non-immune, epidural  FHR Category II  Plan:  Patient comfortable with epidural.   Decelerations resolved with position change. Will continue to monitor tracing.   Continue orders as written. May start IV pitocin at 0300 for inadequate cervical change.   Reassess as needed.    Diona Fanti,  CNM 12/08/2016, [phone]

## 2016-12-08 NOTE — H&P (Signed)
Obstetric History and Physical  Alexis Snow is a 27 y.o. G2P0010 with IUP at [redacted]w[redacted]d presenting for induction of labor due to gestational diabetes-diet controlled. Estimated fetal weight eight (8) pounds and five (5) ounces by ultrasound completed in office 12/04/2016.  Reports irregular contractions. Denies vaginal bleeding or leakage of fluid. Endorses good fetal movement.   Denies difficulty breathing or respiratory distress, chest pain, abdominal pain, and leg pain or swelling.    Prenatal Course  Source of Care: [facility]. Initial prenatal visit at [redacted]w[redacted]d, total visits: 12.  Pregnancy complications or risks: Gestational diabetes-diet controlled, grade 3 placenta  Prenatal labs and studies:  ABO, Rh: --/--/A POS (08/14 6712)  Antibody: NEG (08/14 4580)  Rubella: <0.90 (12/29 9983)   Varicella: 1,185 (12/29 0955)  RPR: Non Reactive (12/29 0955)   HBsAg: Negative (12/29 0955)   HIV: Non Reactive (12/29 0955)  JAS:NKNLZJQB (07/24 1044)  One (1) hr Glucola: 145 (06/01 0938)  Three (3) hr Glucola: 58, 182, 189, 174  Genetic screening Declined  Anatomy US normal  Past Medical History:  Diagnosis Date  . Gestational diabetes   . Vitamin D deficiency     Past Surgical History:  Procedure Laterality Date  . TONSILLECTOMY AND ADENOIDECTOMY      OB History  Gravida Para Term Preterm AB Living  2       1    SAB TAB Ectopic Multiple Live Births    1          # Outcome Date GA Lbr Len/2nd Weight Sex Delivery Anes PTL Lv  2 Current           1 TAB 2013              Social History   Social History  . Marital status: Married    Spouse name: N/A  . Number of children: N/A  . Years of education: N/A   Social History Main Topics  . Smoking status: Never Smoker  . Smokeless tobacco: Never Used  . Alcohol use No     Comment: wine - 3 x a week  . Drug use: No  . Sexual activity: Yes    Birth control/ protection: None   Other Topics Concern  .  None   Social History Narrative  . None    Family History  Problem Relation Age of Onset  . Cancer Maternal Grandmother   . Heart disease Maternal Grandmother   . Diabetes Maternal Grandmother   . Cancer Maternal Grandfather   . Heart disease Paternal Grandfather   . Breast cancer Neg Hx   . Ovarian cancer Neg Hx   . Colon cancer Neg Hx     Prescriptions Prior to Admission  Medication Sig Dispense Refill Last Dose  . Prenatal Vit-Fe Fumarate-FA (PRENATAL MULTIVITAMIN) TABS tablet Take 1 tablet by mouth daily at 12 noon.   12/07/2016 at Unknown time    No Known Allergies  Review of Systems: Negative except for what is mentioned in HPI.  Physical Exam:  Temp:  [98.1 F (36.7 C)-99 F (37.2 C)] 98.1 F (36.7 C) (08/14 0726) Pulse Rate:  [80-94] 80 (08/14 0726) Resp:  [18] 18 (08/14 0530) BP: (111-119)/(78-80) 119/78 (08/14 0726) Weight:  [158 lb (71.7 kg)] 158 lb (71.7 kg) (08/14 0530)   GENERAL: Well-developed, well-nourished female in no acute distress.   LUNGS: Clear to auscultation bilaterally.   HEART: Regular rate and rhythm.  ABDOMEN: Soft, nontender, nondistended, gravid.  EXTREMITIES: Nontender, no  edema, 2+ distal pulses.  Cervical Exam: deferred, exam by RN.   FHT:  Baseline rate 125 bpm   Variability moderate  Accelerations present   Decelerations none  Contractions: Every seven (7) to 10 mins   Pertinent Labs/Studies:   Results for orders placed or performed during the hospital encounter of 12/08/16 (from the past 24 hour(s))  CBC     Status: Abnormal   Collection Time: 12/08/16  6:38 AM  Result Value Ref Range   WBC 8.1 3.6 - 11.0 K/uL   RBC 3.75 (L) 3.80 - 5.20 MIL/uL   Hemoglobin 13.0 12.0 - 16.0 g/dL   HCT 37.4 35.0 - 47.0 %   MCV 99.9 80.0 - 100.0 fL   MCH 34.6 (H) 26.0 - 34.0 pg   MCHC 34.6 32.0 - 36.0 g/dL   RDW 13.5 11.5 - 14.5 %   Platelets 180 150 - 440 K/uL  Type and screen [place]     Status: None    Collection Time: 12/08/16  6:38 AM  Result Value Ref Range   ABO/RH(D) A POS    Antibody Screen NEG    Sample Expiration 12/11/2016   Comprehensive metabolic panel     Status: Abnormal   Collection Time: 12/08/16  6:38 AM  Result Value Ref Range   Sodium 138 135 - 145 mmol/L   Potassium 3.6 3.5 - 5.1 mmol/L   Chloride 109 101 - 111 mmol/L   CO2 21 (L) 22 - 32 mmol/L   Glucose, Bld 75 65 - 99 mg/dL   BUN 11 6 - 20 mg/dL   Creatinine, Ser 0.55 0.44 - 1.00 mg/dL   Calcium 8.7 (L) 8.9 - 10.3 mg/dL   Total Protein 6.4 (L) 6.5 - 8.1 g/dL   Albumin 3.3 (L) 3.5 - 5.0 g/dL   AST 24 15 - 41 U/L   ALT 19 14 - 54 U/L   Alkaline Phosphatase 216 (H) 38 - 126 U/L   Total Bilirubin 0.5 0.3 - 1.2 mg/dL   GFR calc non Af Amer >60 >60 mL/min   GFR calc Af Amer >60 >60 mL/min   Anion gap 8 5 - 15    Assessment : Alexis Snow is a 26 y.o. G2P0010 at [redacted]w[redacted]d being admitted for induction of labor due to gestational diabetes-diet controlled, Rh positive, GBS negative, Rubella non-immune  FHR Category I  Plan:  Admit to birthing suite for induction of labor, see orders.   Discussion with patient regarding induction option including cytotec, pitocin and foley bulb placement as well as associated risks and benefits. Decision for cytotec followed by foley bulb placement when possible.   Will check blood sugar on admission and every four (4) hours, see orders.    Plans natural childbirth, breastfeeding, and delayed cord clamping.   Encouraged ambulation, change of position, and use of doula service as needed.   Dr DeFrancesco notified of patient admission and plan of care.    Diona Fanti, CNM [facility], [facility]

## 2016-12-08 NOTE — Anesthesia Preprocedure Evaluation (Signed)
Anesthesia Evaluation  Patient identified by MRN, date of birth, ID band Patient awake    Reviewed: Allergy & Precautions, NPO status , Patient's Chart, lab work & pertinent test results  History of Anesthesia Complications Negative for: history of anesthetic complications  Airway Mallampati: II  TM Distance: >3 FB Neck ROM: Full    Dental no notable dental hx.    Pulmonary neg pulmonary ROS, neg sleep apnea, neg COPD,    breath sounds clear to auscultation- rhonchi (-) wheezing      Cardiovascular Exercise Tolerance: Good (-) hypertension(-) CAD and (-) Past MI  Rhythm:Regular Rate:Normal - Systolic murmurs and - Diastolic murmurs    Neuro/Psych negative neurological ROS  negative psych ROS   GI/Hepatic negative GI ROS, Neg liver ROS,   Endo/Other  diabetes (diet controlled), Gestational  Renal/GU negative Renal ROS     Musculoskeletal negative musculoskeletal ROS (+)   Abdominal (+) - obese, Gravid abdomen  Peds  Hematology negative hematology ROS (+)   Anesthesia Other Findings Past Medical History: No date: Gestational diabetes No date: Vitamin D deficiency  IOL for gDM  Reproductive/Obstetrics                             Anesthesia Physical Anesthesia Plan  ASA: II  Anesthesia Plan: Epidural   Post-op Pain Management:    Induction:   PONV Risk Score and Plan: 2  Airway Management Planned:   Additional Equipment:   Intra-op Plan:   Post-operative Plan:   Informed Consent: I have reviewed the patients History and Physical, chart, labs and discussed the procedure including the risks, benefits and alternatives for the proposed anesthesia with the patient or authorized representative who has indicated his/her understanding and acceptance.     Plan Discussed with: Anesthesiologist  Anesthesia Plan Comments: (Plan for epidural for labor, discussed epidural vs spinal vs  GA if need for csection)        Anesthesia Quick Evaluation

## 2016-12-09 ENCOUNTER — Encounter: Payer: Self-pay | Admitting: Obstetrics and Gynecology

## 2016-12-09 ENCOUNTER — Encounter
Admission: EM | Disposition: A | Discharge status: Home / Self Care | Payer: Self-pay | Source: Home / Self Care | Attending: Certified Nurse Midwife

## 2016-12-09 DIAGNOSIS — Z3A39 39 weeks gestation of pregnancy: Secondary | ICD-10-CM

## 2016-12-09 DIAGNOSIS — O24420 Gestational diabetes mellitus in childbirth, diet controlled: Secondary | ICD-10-CM

## 2016-12-09 DIAGNOSIS — O480 Post-term pregnancy: Secondary | ICD-10-CM

## 2016-12-09 DIAGNOSIS — Z98891 History of uterine scar from previous surgery: Secondary | ICD-10-CM

## 2016-12-09 LAB — RPR: RPR Ser Ql: NONREACTIVE

## 2016-12-09 LAB — GLUCOSE, CAPILLARY
GLUCOSE-CAPILLARY: 69 mg/dL (ref 65–99)
GLUCOSE-CAPILLARY: 75 mg/dL (ref 65–99)
GLUCOSE-CAPILLARY: 81 mg/dL (ref 65–99)
Glucose-Capillary: 73 mg/dL (ref 65–99)

## 2016-12-09 SURGERY — Surgical Case
Anesthesia: Epidural | Site: Abdomen | Wound class: Clean Contaminated

## 2016-12-09 MED ORDER — FENTANYL CITRATE (PF) 100 MCG/2ML IJ SOLN
INTRAMUSCULAR | Status: AC
  Filled 2016-12-09: qty 2

## 2016-12-09 MED ORDER — LIDOCAINE HCL (PF) 2 % IJ SOLN
INTRAMUSCULAR | Status: AC
  Filled 2016-12-09: qty 8

## 2016-12-09 MED ORDER — MORPHINE SULFATE (PF) 2 MG/ML IV SOLN: 2.0000 mg | INTRAVENOUS | Status: DC | PRN

## 2016-12-09 MED ORDER — COCONUT OIL OIL
1.0000 | TOPICAL_OIL | Status: DC | PRN
Start: 2016-12-09 — End: 2016-12-12
  Administered 2016-12-10: 1 via TOPICAL
  Filled 2016-12-09: qty 120

## 2016-12-09 MED ORDER — LACTATED RINGERS IV SOLN
INTRAVENOUS | Status: DC | PRN
  Administered 2016-12-09: 12:00:00 via INTRAVENOUS

## 2016-12-09 MED ORDER — SIMETHICONE 80 MG PO CHEW
80.0000 mg | CHEWABLE_TABLET | ORAL | Status: DC
  Filled 2016-12-09: qty 1

## 2016-12-09 MED ORDER — MENTHOL 3 MG MT LOZG
1.0000 | LOZENGE | OROMUCOSAL | Status: DC | PRN
  Filled 2016-12-09: qty 9

## 2016-12-09 MED ORDER — SENNOSIDES-DOCUSATE SODIUM 8.6-50 MG PO TABS
2.0000 | ORAL_TABLET | ORAL | Status: DC
  Administered 2016-12-10 – 2016-12-11 (×2): 2 via ORAL
  Filled 2016-12-09 (×2): qty 2

## 2016-12-09 MED ORDER — FENTANYL CITRATE (PF) 100 MCG/2ML IJ SOLN
25.0000 ug | INTRAMUSCULAR | Status: DC | PRN
  Administered 2016-12-09 (×2): 25 ug via INTRAVENOUS

## 2016-12-09 MED ORDER — MORPHINE SULFATE (PF) 0.5 MG/ML IJ SOLN
INTRAMUSCULAR | Status: AC
  Filled 2016-12-09: qty 10

## 2016-12-09 MED ORDER — OXYTOCIN 40 UNITS IN LACTATED RINGERS INFUSION - SIMPLE MED: 2.5000 [IU]/h | INTRAVENOUS | Status: AC

## 2016-12-09 MED ORDER — DIBUCAINE 1 % RE OINT: 1.0000 "application " | TOPICAL_OINTMENT | RECTAL | Status: DC | PRN

## 2016-12-09 MED ORDER — DIPHENHYDRAMINE HCL 25 MG PO CAPS: 25.0000 mg | ORAL_CAPSULE | Freq: Four times a day (QID) | ORAL | Status: DC | PRN

## 2016-12-09 MED ORDER — FENTANYL CITRATE (PF) 100 MCG/2ML IJ SOLN
INTRAMUSCULAR | Status: DC | PRN
  Administered 2016-12-09 (×3): 50 ug via INTRAVENOUS

## 2016-12-09 MED ORDER — OXYTOCIN 40 UNITS IN LACTATED RINGERS INFUSION - SIMPLE MED
INTRAVENOUS | Status: DC | PRN
  Administered 2016-12-09: 600 mL via INTRAVENOUS

## 2016-12-09 MED ORDER — LIDOCAINE 5 % EX PTCH
MEDICATED_PATCH | CUTANEOUS | Status: DC | PRN
  Administered 2016-12-09: 1 via TRANSDERMAL

## 2016-12-09 MED ORDER — WITCH HAZEL-GLYCERIN EX PADS: 1.0000 "application " | MEDICATED_PAD | CUTANEOUS | Status: DC | PRN

## 2016-12-09 MED ORDER — IBUPROFEN 800 MG PO TABS
800.0000 mg | ORAL_TABLET | Freq: Three times a day (TID) | ORAL | Status: DC
  Administered 2016-12-09 – 2016-12-11 (×6): 800 mg via ORAL
  Filled 2016-12-09 (×6): qty 1

## 2016-12-09 MED ORDER — MIDAZOLAM HCL 5 MG/5ML IJ SOLN
INTRAMUSCULAR | Status: DC | PRN
  Administered 2016-12-09: 2 mg via INTRAVENOUS
  Administered 2016-12-09 (×2): 1 mg via INTRAVENOUS

## 2016-12-09 MED ORDER — MIDAZOLAM HCL 2 MG/2ML IJ SOLN
INTRAMUSCULAR | Status: AC
Start: 2016-12-09 — End: 2016-12-09
  Filled 2016-12-09: qty 2

## 2016-12-09 MED ORDER — SIMETHICONE 80 MG PO CHEW
80.0000 mg | CHEWABLE_TABLET | Freq: Three times a day (TID) | ORAL | Status: DC
  Administered 2016-12-09 – 2016-12-11 (×7): 80 mg via ORAL
  Filled 2016-12-09 (×6): qty 1

## 2016-12-09 MED ORDER — ONDANSETRON HCL 4 MG/2ML IJ SOLN: 4.0000 mg | Freq: Once | INTRAMUSCULAR | Status: DC | PRN

## 2016-12-09 MED ORDER — FERROUS SULFATE 325 (65 FE) MG PO TABS
325.0000 mg | ORAL_TABLET | Freq: Two times a day (BID) | ORAL | Status: DC
  Administered 2016-12-10: 325 mg via ORAL
  Filled 2016-12-09: qty 1

## 2016-12-09 MED ORDER — SOD CITRATE-CITRIC ACID 500-334 MG/5ML PO SOLN
ORAL | Status: AC
  Administered 2016-12-09: 30 mL via ORAL
  Filled 2016-12-09: qty 15

## 2016-12-09 MED ORDER — MEASLES, MUMPS & RUBELLA VAC ~~LOC~~ INJ
0.5000 mL | INJECTION | Freq: Once | SUBCUTANEOUS | Status: DC
  Filled 2016-12-09: qty 0.5

## 2016-12-09 MED ORDER — CEFAZOLIN SODIUM-DEXTROSE 2-4 GM/100ML-% IV SOLN
2.0000 g | Freq: Once | INTRAVENOUS | Status: AC
  Administered 2016-12-09: 2 g via INTRAVENOUS
  Filled 2016-12-09: qty 100

## 2016-12-09 MED ORDER — LACTATED RINGERS IV SOLN: INTRAVENOUS | Status: DC

## 2016-12-09 MED ORDER — SIMETHICONE 80 MG PO CHEW
80.0000 mg | CHEWABLE_TABLET | ORAL | Status: DC | PRN
  Administered 2016-12-10 – 2016-12-11 (×2): 80 mg via ORAL
  Filled 2016-12-09 (×2): qty 1

## 2016-12-09 MED ORDER — FENTANYL CITRATE (PF) 100 MCG/2ML IJ SOLN
INTRAMUSCULAR | Status: AC
  Administered 2016-12-09: 25 ug via INTRAVENOUS
  Filled 2016-12-09: qty 2

## 2016-12-09 MED ORDER — OXYCODONE-ACETAMINOPHEN 5-325 MG PO TABS: 2.0000 | ORAL_TABLET | ORAL | Status: DC | PRN

## 2016-12-09 MED ORDER — LIDOCAINE 5 % EX PTCH
MEDICATED_PATCH | CUTANEOUS | Status: AC
  Filled 2016-12-09: qty 1

## 2016-12-09 MED ORDER — ACETAMINOPHEN 325 MG PO TABS
650.0000 mg | ORAL_TABLET | ORAL | Status: DC | PRN
  Administered 2016-12-09 – 2016-12-11 (×7): 650 mg via ORAL
  Filled 2016-12-09 (×7): qty 2

## 2016-12-09 MED ORDER — PRENATAL MULTIVITAMIN CH
1.0000 | ORAL_TABLET | Freq: Every day | ORAL | Status: DC
  Administered 2016-12-10 – 2016-12-11 (×2): 1 via ORAL
  Filled 2016-12-09 (×2): qty 1

## 2016-12-09 MED ORDER — MIDAZOLAM HCL 2 MG/2ML IJ SOLN
INTRAMUSCULAR | Status: AC
  Filled 2016-12-09: qty 2

## 2016-12-09 MED ORDER — LIDOCAINE 5 % EX PTCH
1.0000 | MEDICATED_PATCH | CUTANEOUS | Status: DC
  Administered 2016-12-10 – 2016-12-11 (×2): 1 via TRANSDERMAL
  Filled 2016-12-09 (×2): qty 1

## 2016-12-09 MED ORDER — OXYCODONE-ACETAMINOPHEN 5-325 MG PO TABS: 1.0000 | ORAL_TABLET | ORAL | Status: DC | PRN

## 2016-12-09 MED ORDER — TETANUS-DIPHTH-ACELL PERTUSSIS 5-2.5-18.5 LF-MCG/0.5 IM SUSP: 0.5000 mL | Freq: Once | INTRAMUSCULAR | Status: DC

## 2016-12-09 MED ORDER — LIDOCAINE HCL (PF) 2 % IJ SOLN
INTRAMUSCULAR | Status: DC | PRN
  Administered 2016-12-09: 20 mL via INTRADERMAL
  Administered 2016-12-09: 10 mL via INTRADERMAL

## 2016-12-09 SURGICAL SUPPLY — 28 items
CANISTER SUCT 3000ML PPV (MISCELLANEOUS) ×3 IMPLANT
CHLORAPREP W/TINT 26ML (MISCELLANEOUS) ×6 IMPLANT
CLOSURE WOUND 1/2 X4 (GAUZE/BANDAGES/DRESSINGS) ×1
DERMABOND ADVANCED (GAUZE/BANDAGES/DRESSINGS) ×2
DERMABOND ADVANCED .7 DNX12 (GAUZE/BANDAGES/DRESSINGS) ×1 IMPLANT
DRSG TELFA 3X8 NADH (GAUZE/BANDAGES/DRESSINGS) ×3 IMPLANT
ELECT REM PT RETURN 9FT ADLT (ELECTROSURGICAL) ×3
ELECTRODE REM PT RTRN 9FT ADLT (ELECTROSURGICAL) ×1 IMPLANT
GAUZE SPONGE 4X4 12PLY STRL (GAUZE/BANDAGES/DRESSINGS) ×3 IMPLANT
GLOVE BIO SURGEON STRL SZ8 (GLOVE) ×15 IMPLANT
GOWN STRL REUS W/ TWL LRG LVL3 (GOWN DISPOSABLE) ×2 IMPLANT
GOWN STRL REUS W/ TWL XL LVL3 (GOWN DISPOSABLE) ×1 IMPLANT
GOWN STRL REUS W/TWL LRG LVL3 (GOWN DISPOSABLE) ×4
GOWN STRL REUS W/TWL XL LVL3 (GOWN DISPOSABLE) ×2
NS IRRIG 1000ML POUR BTL (IV SOLUTION) ×3 IMPLANT
PACK C SECTION AR (MISCELLANEOUS) ×3 IMPLANT
PAD ABD DERMACEA PRESS 5X9 (GAUZE/BANDAGES/DRESSINGS) ×3 IMPLANT
PAD OB MATERNITY 4.3X12.25 (PERSONAL CARE ITEMS) ×3 IMPLANT
PAD PREP 24X41 OB/GYN DISP (PERSONAL CARE ITEMS) ×3 IMPLANT
SPONGE LAP 18X18 5 PK (GAUZE/BANDAGES/DRESSINGS) ×3 IMPLANT
STRAP SAFETY BODY (MISCELLANEOUS) ×3 IMPLANT
STRIP CLOSURE SKIN 1/2X4 (GAUZE/BANDAGES/DRESSINGS) ×2 IMPLANT
SUT CHROMIC 1-0 (SUTURE) ×9 IMPLANT
SUT MAXON ABS #0 GS21 30IN (SUTURE) ×6 IMPLANT
SUT PLAIN GUT 0 (SUTURE) IMPLANT
SUT VIC AB 2-0 CT1 27 (SUTURE) ×4
SUT VIC AB 2-0 CT1 TAPERPNT 27 (SUTURE) ×2 IMPLANT
SUT VIC AB 4-0 KS 27 (SUTURE) ×3 IMPLANT

## 2016-12-09 NOTE — Progress Notes (Signed)
PRE OP C/S COUNSELING NOTE:  Preoperative diagnoses: 1. Term pregnancy, failed induction 2. Arrest of dilation 3. Nonreassuring fetal heart rate tracing 4. Gestational diabetes mellitus, diet controlled 5. Rubella nonimmune  Procedure: Primary low cervical transverse cesarean section  Surgeon: Alivia Cimino First Asst.: Dani Gobble  Preoperative counseling: The patient has been counseled regarding indications for Cesarean Section delivery. (See intra partum notes for details). She is understanding of the planned procedure and is aware of and accepting of all surgical risks which include but are not limited to: Bleeding with possible need for blood product transfusions; Infection with possible need for additional antibiotic therapy; Blood Clot Disorders with possible need for blood thinners; Pelvic organ Injury with possible need for repair (Bowel/Bladder/Ureters/Ovaries/Tubes/etc); Anesthesia risks; Fetal Injury; and additional Post Op complications including incision dehiscence and possible future pregnancy placenta implantation issues. All questions have been answered. Informed consent is given.  Alexis Snow Alexis Plants, MD, FACOG [facility]

## 2016-12-09 NOTE — Progress Notes (Addendum)
Patient ID: Alexis Snow, female   DOB: 23-Mar-1991, 26 y.o.   MRN: [id number]  Alexis Snow is a 26 y.o. G2P0010 at [redacted]w[redacted]d by first trimester ultrasound admitted for induction of labor due to Gestational diabetes-diet controlled.  Subjective:  Pt in hands and knees, resting quietly with eyes closed. Reports increased pelvic pressure with contractions.   Denies difficulty breathing or respiratory distress, chest pain, abdominal pain, vaginal bleeding, and leg pain or swelling.   Objective:  Temp:  [98.1 F (36.7 C)-98.5 F (36.9 C)] 98.1 F (36.7 C) (08/15 0715) Pulse Rate:  [62-181] 80 (08/15 0815) Resp:  [16] 16 (08/14 1915) BP: (92-134)/(54-90) 127/85 (08/15 0815) SpO2:  [95 %-100 %] 100 % (08/15 0815)   Intake/Output Summary (Last 24 hours) at 12/09/16 0948 Last data filed at 12/08/16 1006  Gross per 24 hour  Intake              480 ml  Output                0 ml  Net              480 ml    FHT:  FHR: 145 bpm, variability: moderate,  accelerations:  Present,  decelerations:  Present intermittent late and variable decelerations that resolve with IVF and position change.   UC:  regular, every three (3) to five (5) minutes with soft resting tone  AROM: clear fluid at 0830, IUPC placed  SVE:   Dilation: 8.5 Effacement (%): 100 Station: -2 Exam by:: Robin Searing RN  Labs: Lab Results  Component Value Date   WBC 8.1 12/08/2016   HGB 13.0 12/08/2016   HCT 37.4 12/08/2016   MCV 99.9 12/08/2016   PLT 180 12/08/2016    Assessment:  Alexis Snow is a 26 y.o. G2P0010 at [redacted]w[redacted]d being admitted for induction of labor due to gestational diabetes-diet controlled, Rh positive, GBS negative, Rubella non-immune, epidural, AROM, IUPC  FHR Category II  Plan:  AROM and IUPC placed  Discussed plan of care with the patient including pitocin augmentation, expected labor progression, and reasons for c-section. Pt and family verbalized understanding.  Decelerations resolved with  position change. Will continue to monitor tracing.   Continue orders as written.   Reassess as needed.   Dr. Enzo Bi here and aware for [facility].    Diona Fanti, CNM 12/09/16 9:50 AM

## 2016-12-09 NOTE — Progress Notes (Addendum)
Patient ID: Kellie Moor, female   DOB: 12-30-1990, 26 y.o.   MRN: [id number]  Umeka Wrench is a 26 y.o. G2P0010 at [redacted]w[redacted]d by first trimester ultrasound admitted for induction of labor due to Gestational diabetes-diet controlled.  Subjective:  Pt resting quietly in bed with eyes, comfortable with epidural.   Denies difficulty breathing or respiratory distress, chest pain, abdominal pain, vaginal bleeding, and leg pain or swelling.   Objective:  Temp:  [98.1 F (36.7 C)-98.5 F (36.9 C)] 98.1 F (36.7 C) (08/15 0715) Pulse Rate:  [62-181] 76 (08/15 0715) Resp:  [16] 16 (08/14 1915) BP: (92-134)/(54-90) 116/80 (08/15 0715) SpO2:  [95 %-100 %] 98 % (08/15 0715)   Intake/Output Summary (Last 24 hours) at 12/09/16 0753 Last data filed at 12/08/16 1006  Gross per 24 hour  Intake              480 ml  Output                0 ml  Net              480 ml    FHT:  FHR: 135 bpm, variability: moderate,  accelerations:  Present,  decelerations:  Present intermittent late decelerations and intermittent prolonged decelerations that resolve with IVF and position change.   UC:  regular, every three (3) to six (6) minutes with soft resting tone  SVE:   Dilation: 7 Effacement (%): 90 Station: -3 Exam by:: Dani Gobble CNM  Labs: Lab Results  Component Value Date   WBC 8.1 12/08/2016   HGB 13.0 12/08/2016   HCT 37.4 12/08/2016   MCV 99.9 12/08/2016   PLT 180 12/08/2016    Assessment:  Bertie Mcconathy is a 26 y.o. G2P0010 at [redacted]w[redacted]d being admitted for induction of labor due to gestational diabetes-diet controlled, Rh positive, GBS negative, Rubella non-immune, epidural  FHR Category II  Plan:  Patient comfortable with epidural.   Decelerations resolved with position change. Will continue to monitor tracing.   Continue orders as written.   Reassess as needed.   Plan of care reviewed with Dr. Enzo Bi, will AROM and place internal monitors when MD arrives at birthing suites.     Diona Fanti, CNM

## 2016-12-09 NOTE — Anesthesia Post-op Follow-up Note (Signed)
Anesthesia QCDR form completed.        

## 2016-12-09 NOTE — Progress Notes (Signed)
Patient ID: Kellie Moor, female   DOB: [birth year], 26 y.o.   MRN: [id number]  Terissa Haffey is a 26 y.o. G2P0010 at [redacted]w[redacted]d by first trimester ultrasound admitted for induction of labor due to Gestational diabetes-diet controlled.  Subjective:  Pt in hands and knees, resting quietly with eyes closed. Reports increased pelvic pressure with contractions.   Denies difficulty breathing or respiratory distress, chest pain, abdominal pain, vaginal bleeding, and leg pain or swelling.   Objective:  Temp:  [98.1 F (36.7 C)-98.5 F (36.9 C)] 98.1 F (36.7 C) (08/15 0715) Pulse Rate:  [62-181] 78 (08/15 1121) Resp:  [16] 16 (08/14 1915) BP: (92-134)/(54-90) 108/78 (08/15 1121) SpO2:  [92 %-100 %] 95 % (08/15 1120)  No intake or output data in the 24 hours ending 12/09/16 1201  FHT:  FHR: 140 bpm, variability: moderate,  accelerations:  Present,  decelerations:  Present early, prolonged, late.   UC:  regular, every three (3) to five (5) minutes with soft resting tone  IUPC replaced. FSE applies.   SVE:   Dilation: 8.5 Effacement (%): 100 Station: -2 Exam by:: Robin Searing RN  Labs: Lab Results  Component Value Date   WBC 8.1 12/08/2016   HGB 13.0 12/08/2016   HCT 37.4 12/08/2016   MCV 99.9 12/08/2016   PLT 180 12/08/2016    Assessment:  Rakeya Glab is a 26 y.o. G2P0010 at [redacted]w[redacted]d being admitted for induction of labor due to gestational diabetes-diet controlled, Rh positive, GBS negative, Rubella non-immune, epidural, AROM, IUPC, FSE  FHR Category II  Plan:  IUPC replaced and FSE placed.  Discussed actual vs expected labor progress, fetal heart rate tracing, and implication on induction of labor.   Decision for c-section, pt verbalizes understanding and agrees with plan of care.   Dr. Enzo Bi notified. CNM instructed to call urgent c-section. OR notified by birthing suites staff.   Two (2) grams Anicef, see orders.    Diona Fanti, CNM 12/09/16 12:02 PM

## 2016-12-09 NOTE — Lactation Note (Signed)
This note was copied from a baby's chart. Lactation Consultation Note  Patient Name: Alexis Snow [facility] Date: 12/09/2016 Reason for consult: Initial assessment   Maternal Data Has patient been taught Hand Expression?: No Does the patient have breastfeeding experience prior to this delivery?: No  Feeding Feeding Type: Breast Fed Length of feed: 20 min  LATCH Score                   Interventions    Lactation Tools Discussed/Used     Consult Status Consult Status: Follow-up Date: 12/10/16 Follow-up type: In-patient  Spoke to FOB about feeding because mom was in and out of sleep. Both stated that baby fed well and that they didn't have any questions or concerns at this time. LC spoke about clusterfeeding and breastfeeding being a give and take relationship to establish milk supply.   Marnee Spring 12/09/2016, 5:53 PM

## 2016-12-09 NOTE — Op Note (Signed)
Alexis Snow PROCEDURE DATE: 12/09/2016  PREOPERATIVE DIAGNOSES: Intrauterine pregnancy at [redacted]w[redacted]d weeks gestation; failed induction, failure to progress: arrest of dilation, non-reassuring fetal status and Gestational diabetes mellitus  POSTOPERATIVE DIAGNOSES: The same and viable female 8 lbs. 5 oz.  PROCEDURE: Primary Low Transverse Cesarean Section  ANESTHESIA:: Epidural ANESTHESIOLOGIST: Dr. Ronelle Nigh  SURGEON:  Dr. Hassell Done A. Elexus Barman ASSISTANT:: Dani Gobble, CNM    INDICATIONS: Alexis Snow is a 26 y.o. G2P0010 at [redacted]w[redacted]d here for cesarean section delivery due to the indications listed under preoperative diagnoses; please see preoperative note for further details.  The risks of cesarean section were discussed with the patient including but not limited to: bleeding which may require transfusion or reoperation; infection which may require antibiotics; injury to bowel, bladder, ureters or other surrounding organs; injury to the fetus; need for additional procedures including hysterectomy in the event of a life-threatening hemorrhage; placental abnormalities wth subsequent pregnancies, incisional problems, thromboembolic phenomenon and other postoperative/anesthesia complications. The patient concurred with the proposed plan, giving informed written consent for the procedure. All questions were addressed.  FINDINGS:  Viable female infant in cephalic presentation.  Apgars 8 and 9.  Clear amniotic fluid.  Intact placenta, three vessel cord.  Normal uterus, fallopian tubes and ovaries bilaterally.  I/O's: Total I/O In: 750 [I.V.:750] Out: 7673 [Urine:1075; Blood:750] SPECIMENS:: None ANTIBIOTIC PROPHYLAXIS: Ancef 2 grams COUNTS:  YES COMPLICATIONS: None immediate  PROCEDURE IN DETAIL: Patient was brought to the operating room where the epidural was dosed. She was placed in the supine position with a right lateral hip roll in place. Foley catheter had been previously inserted and was draining  clear yellow urine. The abdomen was prepped with ChloraPrep solution and draped in a sterile manner. The vagina was prepped with Betadine. After timeout and checking for adequate level of anesthesia the procedure was started. Pfannenstiel incision was made into the abdomen. The fascia was incised transversely and extended bilaterally with Mayo scissors.  The midline raphae was identified and separated and the peritoneum was entered. Bladder flap was created over lower uterine segment with sharp dissection. A low transverse incision was made in the uterus and this was extended cephalad and caudad in standard fashion. Amniotic fluid was clear. The infant was delivered through vertex presentation and was noted to be active at birth. Loose nuchal cord was reduced. The umbilical cord was doubly clamped and cut and the infant was handed off to the resuscitating team. Cord blood sampling was not obtained. Placenta was expressed from the uterus. Uterus was externalized onto the anterior abdominal wall and was cleared of all debris with laps. The uterine incision was closed in 2 layers using #1 chromic suture.The first layer was closed in a running locking manner. The second layer was an imbricating layer. Good hemostasis was noted. The uterus was then placed back into the abdominal pelvic cavity. Gutters were cleared of all debris with laps. The incision was then closed in layers. 0 Maxon was used on the fascia in a simple running manner. Subcutaneous tissues were not reapproximated. The skin was closed with a subcuticular stitch of 4-0 Vicryl. Dermabond glue was placed over the incision. Steri-Strips were placed. A Lidoderm patch was applied for analgesia. The patient was then mobilized and taken to the recovery room in satisfactory condition.      Janari Yamada A. Zipporah Plants, MD, FACOG [facility]

## 2016-12-09 NOTE — Progress Notes (Signed)
Pt in room recovering from c/s. Reports pain 10/10. Anestisia notified twice that pt was missing PACU orders. Anesthesia Verbalized that he was currently putting them in.

## 2016-12-09 NOTE — Transfer of Care (Signed)
Immediate Anesthesia Transfer of Care Note  Patient: Alexis Snow  Procedure(s) Performed: Procedure(s): CESAREAN SECTION (N/A)  Patient Location: PACU and Nursing Unit  Anesthesia Type:Epidural  Level of Consciousness: awake, alert  and oriented  Airway & Oxygen Therapy: Patient Spontanous Breathing  Post-op Assessment: Report given to RN and Post -op Vital signs reviewed and stable  Post vital signs: Reviewed and stable  Last Vitals:  Vitals:   12/09/16 1121 12/09/16 1347  BP: 108/78 113/86  Pulse: 78 98  Resp:  15  Temp:    SpO2:  99%    Last Pain:  Vitals:   12/09/16 0715  TempSrc:   PainSc: 0-No pain         Complications: No apparent anesthesia complications

## 2016-12-10 LAB — CBC
HCT: 28.5 % — ABNORMAL LOW (ref 35.0–47.0)
Hemoglobin: 9.8 g/dL — ABNORMAL LOW (ref 12.0–16.0)
MCH: 35 pg — ABNORMAL HIGH (ref 26.0–34.0)
MCHC: 34.4 g/dL (ref 32.0–36.0)
MCV: 101.5 fL — ABNORMAL HIGH (ref 80.0–100.0)
PLATELETS: 131 10*3/uL — AB (ref 150–440)
RBC: 2.81 MIL/uL — AB (ref 3.80–5.20)
RDW: 13.4 % (ref 11.5–14.5)
WBC: 16 10*3/uL — AB (ref 3.6–11.0)

## 2016-12-10 LAB — GLUCOSE, CAPILLARY: GLUCOSE-CAPILLARY: 63 mg/dL — AB (ref 65–99)

## 2016-12-10 MED ORDER — FERROUS SULFATE 325 (65 FE) MG PO TABS
325.0000 mg | ORAL_TABLET | Freq: Three times a day (TID) | ORAL | Status: DC
  Administered 2016-12-10 – 2016-12-11 (×5): 325 mg via ORAL
  Filled 2016-12-10 (×5): qty 1

## 2016-12-10 NOTE — Progress Notes (Signed)
Subjective: Post Op Day 1 Day Post-Op: Cesarean Delivery Patient reports incisional pain, tolerating PO, ambulating and Breastfeeding.    Objective: Vital signs in last 24 hours: Temp:  [97.7 F (36.5 C)-99.7 F (37.6 C)] 98 F (36.7 C) (08/16 1223) Pulse Rate:  [65-100] 100 (08/16 1223) Resp:  [9-24] 18 (08/16 1223) BP: (101-121)/(60-89) 101/71 (08/16 1223) SpO2:  [96 %-100 %] 99 % (08/16 1223)  Physical Exam:  General: alert, cooperative and appears stated age Lochia: appropriate Uterine Fundus: firm Incision: healing well, no significant drainage DVT Evaluation: No evidence of DVT seen on physical exam. Negative Homan's sign. Calf/Ankle edema is present.   Recent Labs  12/08/16 0638 12/10/16 0408  HGB 13.0 9.8*  HCT 37.4 28.5*     Assessment/Plan: Status post Cesarean section. Doing well postoperatively.  Continue current care.  Naya Ilagan N Treesa Mccully 12/10/2016, 2:22 PM

## 2016-12-10 NOTE — Anesthesia Postprocedure Evaluation (Signed)
Anesthesia Post Note  Patient: Alexis Snow  Procedure(s) Performed: Procedure(s) (LRB): CESAREAN SECTION (N/A)  Patient location during evaluation: Mother Baby Anesthesia Type: Epidural Level of consciousness: awake and alert and oriented Pain management: pain level controlled Vital Signs Assessment: post-procedure vital signs reviewed and stable Respiratory status: respiratory function stable Cardiovascular status: stable Postop Assessment: no headache, no signs of nausea or vomiting, epidural receding, no backache, patient able to bend at knees and adequate PO intake Anesthetic complications: no     Last Vitals:  Vitals:   12/09/16 2334 12/10/16 0444  BP: 113/73 106/72  Pulse: 72 65  Resp: 20 18  Temp: 37.6 C 37 C  SpO2: 97% 99%    Last Pain:  Vitals:   12/10/16 0644  TempSrc:   PainSc: 0-No pain                 Blima Singer

## 2016-12-11 LAB — GLUCOSE, CAPILLARY
GLUCOSE-CAPILLARY: 118 mg/dL — AB (ref 65–99)
Glucose-Capillary: 80 mg/dL (ref 65–99)

## 2016-12-11 MED ORDER — VITAMIN D3 125 MCG (5000 UT) PO CAPS: 1.0000 | ORAL_CAPSULE | Freq: Every day | ORAL | 2 refills | Status: DC

## 2016-12-11 MED ORDER — HYDROCODONE-ACETAMINOPHEN 5-325 MG PO TABS
1.0000 | ORAL_TABLET | ORAL | 0 refills | Status: DC | PRN
Start: 2016-12-11 — End: 2016-12-18

## 2016-12-11 MED ORDER — IBUPROFEN 800 MG PO TABS: 800.0000 mg | ORAL_TABLET | Freq: Three times a day (TID) | ORAL | 1 refills | Status: DC | PRN

## 2016-12-11 MED ORDER — FUSION PLUS PO CAPS: 1.0000 | ORAL_CAPSULE | Freq: Every day | ORAL | 1 refills | Status: DC

## 2016-12-11 NOTE — Progress Notes (Signed)
Discharge order received from doctor. Reviewed discharge instructions and prescriptions with patient and answered all questions. Incision cleaning kit given. Follow up appointment given. Patient verbalized understanding. ID bands checked. Patient discharged home with infant via wheelchair by nursing/auxillary.    Launa Goedken Garner, RN 

## 2016-12-11 NOTE — Discharge Instructions (Signed)
Please call your doctor or return to the ER if you experience any chest pains, shortness of breath, dizziness, visual changes, fever greater than 101, any heavy bleeding (saturating more than 1 pad per hour), large clots, or foul smelling discharge, any worsening abdominal pain and cramping that is not controlled by pain medication, or any signs of postpartum depression. No tampons, enemas, douches, or sexual intercourse for 6 weeks. Also avoid tub baths, hot tubs, or  swimming for 6 weeks.    Check your incision daily for any signs of infection such as redness, warmth, swelling, increased pain, or pur or foul smelling drainage   Activty: Do not lift over 10 lbs for 6 weeks  No driving for 2 weeks  Pelvic rest 6 weeks

## 2016-12-11 NOTE — Discharge Summary (Signed)
Physician Obstetric Discharge Summary  Patient ID: Alexis Snow MRN: [id number] DOB/AGE: Aug 02, 1990 26 y.o.  Reason for Admission: induction of labor Prenatal Procedures: ultrasound Intrapartum Procedures: cesarean: low cervical, transverse Postpartum Procedures: Rubella Ig Complications-Operative and Postpartum: anemia  Delivery Note At 12:42 PM a viable and healthy female was delivered via C-Section, Low Transverse (Presentation: ;  ).  APGAR: 8, 9; weight 8 lb 4.6 oz (3760 g).    Mom to postpartum.  Baby to Couplet care / Skin to Skin.  Alexis Snow 12/11/2016, 6:16 PM    .    H/H:  Lab Results  Component Value Date/Time   HGB 9.8 (L) 12/10/2016 04:08 AM   HGB 11.7 09/25/2016 09:38 AM   HCT 28.5 (L) 12/10/2016 04:08 AM   HCT 34.3 09/25/2016 09:38 AM    Brief Hospital Course: Alexis Snow is a G2P0010 who underwent cesarean section on 12/08/2016 - 12/09/2016.  Patient had an uncomplicated surgery; for further details of this surgery, please refer to the operative note.  Patient had an uncomplicated postpartum course.  By time of discharge on POD#2/PPD#2, her pain was controlled on oral pain medications; she had appropriate lochia and was ambulating, voiding without difficulty, tolerating regular diet and passing flatus.   She was deemed stable for discharge to home.    Discharge Diagnoses: Term Pregnancy-delivered, Failed induction and LTCS, postop anemia  Discharge Information: Date: 12/11/2016 Activity: pelvic rest Diet: routine Baby feeding: plans to breastfeed Contraception: undecided Medications: PNV, Tylenol #3, Ibuprofen, Colace and Iron Discharged Condition: good Instructions: refer to practice specific booklet Discharge to: home  Signed: Joylene Igo, CNM 12/11/2016, 6:16 PM

## 2016-12-11 NOTE — Progress Notes (Signed)
Subjective: Post Op Day 2 Days Post-Op: Cesarean Delivery Patient reports incisional pain, + flatus, no problems voiding, ambulating and Breastfeeding.    Objective: Vital signs in last 24 hours: Temp:  [97.9 F (36.6 C)-98.6 F (37 C)] 98.1 F (36.7 C) (08/17 0745) Pulse Rate:  [76-100] 76 (08/17 0745) Resp:  [18] 18 (08/17 0745) BP: (101-112)/(71-87) 112/80 (08/17 0745) SpO2:  [99 %-100 %] 99 % (08/17 0745)  Physical Exam:  General: alert, cooperative and appears stated age Lochia: appropriate Uterine Fundus: firm Incision: healing well DVT Evaluation: No evidence of DVT seen on physical exam. Negative Homan's sign.   Recent Labs  12/10/16 0408  HGB 9.8*  HCT 28.5*     Assessment/Plan: Status post Cesarean section. Doing well postoperatively.  Continue current care.  Alexis Snow N Efrat Zuidema 12/11/2016, 8:18 AM

## 2016-12-11 NOTE — Progress Notes (Signed)
pt discharged home with infant.  Discharge instructions, prescriptions and follow up appointment given to and reviewed with pt.  Pt verbalized understanding, all questions answered.  Escorted by auxiliary. 

## 2016-12-16 ENCOUNTER — Telehealth: Payer: Self-pay | Admitting: Obstetrics and Gynecology

## 2016-12-16 NOTE — Telephone Encounter (Signed)
Pt is 7 day s/p c/s. Taking ibup 800 qh. NO n/v/d. No fever. Pain is tolerable with ibup. Incision is not red or warm to the touch. Minimal drainage on steri strips. VB is very light. Pt feels very weak and drained. She is cluster feeding her baby. Sleeping about 3 h a nite. Not eating well. Only has 10oz of h20 today. Pt sounds tearful. She has support- husband and Mom. Advised pt to eat small meals q few hours. Push fluids. Rest when baby rest. Ask for help. Encouraged pt to push thru and it will get better. She is to f/u with jml at visit on Friday.

## 2016-12-16 NOTE — Telephone Encounter (Signed)
Pt stated that she is feeling weak today. Pt denies nausea. Pt stated that she does have some abdominal pain but she thinks it's because she hasn't had a bowel movement in 2 days. Pt stated that she has only been taking Motrin for pain every 8 hours. Pt stated that her incision is a little puffy no drainage. Pt is also concerned that she is has to remind herself to urinate because she doesn't feel the urge to urinate. Pt stated that she hasn't had much of an appetite and would like to speak with a nurse. Please advise. Thanks TNP

## 2016-12-18 ENCOUNTER — Encounter: Payer: Self-pay | Admitting: Obstetrics and Gynecology

## 2016-12-18 ENCOUNTER — Ambulatory Visit (INDEPENDENT_AMBULATORY_CARE_PROVIDER_SITE_OTHER): Payer: 59 | Admitting: Obstetrics and Gynecology

## 2016-12-18 ENCOUNTER — Encounter: Payer: 59 | Admitting: Certified Nurse Midwife

## 2016-12-18 VITALS — BP 108/74 | HR 96 | Ht 59.0 in | Wt 131.2 lb

## 2016-12-18 DIAGNOSIS — F53 Postpartum depression: Secondary | ICD-10-CM

## 2016-12-18 DIAGNOSIS — F419 Anxiety disorder, unspecified: Secondary | ICD-10-CM

## 2016-12-18 DIAGNOSIS — Z4889 Encounter for other specified surgical aftercare: Secondary | ICD-10-CM

## 2016-12-18 DIAGNOSIS — Z98891 History of uterine scar from previous surgery: Secondary | ICD-10-CM

## 2016-12-18 DIAGNOSIS — O99345 Other mental disorders complicating the puerperium: Secondary | ICD-10-CM

## 2016-12-18 DIAGNOSIS — K5909 Other constipation: Secondary | ICD-10-CM

## 2016-12-18 MED ORDER — SERTRALINE HCL 50 MG PO TABS: 50.0000 mg | ORAL_TABLET | Freq: Every day | ORAL | 3 refills | Status: DC

## 2016-12-18 NOTE — Progress Notes (Signed)
    OBSTETRICS/GYNECOLOGY POST-OPERATIVE CLINIC VISIT  Subjective:     Alexis Snow is a 26 y.o. female who presents to the clinic 1 weeks status post primary low-transverse C-section for non-reassuring fetal heart tracing. Eating a regular diet without difficulty. Bowel movements are abnormal with constipation noted, despite using Colace (however only using it once daily). Pain is controlled without any medications.  Of note, patient has a h/o anxiety, but no h/o depression. Now picking fingers until bleeding (notes she used to do this with her anxiety prior to pregnancy.  Was on Zoloft and anxiety in the past), and is tearful more often.   Denies suicidal thoughts, but does note that she has had the thought once of "wishing she didn't have to be here".   Edinburgh Depression Scale score is 23.   The following portions of the patient's history were reviewed and updated as appropriate: allergies, current medications, past family history, past medical history, past social history, past surgical history and problem list.  Review of Systems Pertinent items noted in HPI and remainder of comprehensive ROS otherwise negative.    Objective:    BP 108/74 (BP Location: Left Arm, Patient Position: Sitting, Cuff Size: Normal)   Pulse 96   Ht 4\' 11"  (1.499 m)   Wt 131 lb 3.2 oz (59.5 kg)   Breastfeeding? Yes   BMI 26.50 kg/m  General:  alert and no distress  Abdomen: soft, bowel sounds active, non-tender  Incision:   healing well, no drainage, no erythema, no hernia, no seroma, no swelling, no dehiscence, incision well approximated. Steri-strips in place.      Assessment:    Doing well postoperatively. S/p Cesarean section.  Postpartum course complicated by postpartum anxiety and depression Constipation  Plan:   1. Continue any current medications (Ibuprofen as needed, not using narcotics).  Will prescribe Miralax for constipation.  Can also increase Colace to BID.  2. Wound care  discussed. Advised to remove steri-strips if not peeling by week 2.  3.  Postpartum anxiety and depression.  Patient notes she wonders if it is postpartum blues that will likely resolve soon.  Discussed that based on her symptoms and Edinburgh score, she is exhibiting signs more so towards depression than just blues, along with her anxiety.  Discussed need for initiation of therapy and likely medication.  Patient hesitant about therapy but will consider.  Also hesitant about medication as she wants to see if this will get better spontaneously.  Advised that postpartum blues typically lasts around 2 weeks.  As she is nearing the 2 week mark s/p delivery, if she notices that her symptoms are persisting, she should initiate medication.  Patient in agreement.  Will prescribe Zoloft. Given patient and family strict precautions, and information regarding [employer] or ER if she needs to walk in to seek immediate help.  4. Activity restrictions: no bending, stooping, or squatting, no lifting more than 10 pounds and pelvic rest 5. Anticipated return to work: 4-6 weeks, if applicable. 6. Follow up: 2 weeks for follow up of postpartum depression and anxiety.     Rubie Maid, MD [facility]

## 2016-12-18 NOTE — Patient Instructions (Signed)
Postpartum Depression and Baby Blues The postpartum period begins right after the birth of a baby. During this time, there is often a great amount of joy and excitement. It is also a time of many changes in the life of the parents. Regardless of how many times a mother gives birth, each child brings new challenges and dynamics to the family. It is not unusual to have feelings of excitement along with confusing shifts in moods, emotions, and thoughts. All mothers are at risk of developing postpartum depression or the "baby blues." These mood changes can occur right after giving birth, or they may occur many months after giving birth. The baby blues or postpartum depression can be mild or severe. Additionally, postpartum depression can go away rather quickly, or it can be a long-term condition. What are the causes? Raised hormone levels and the rapid drop in those levels are thought to be a main cause of postpartum depression and the baby blues. A number of hormones change during and after pregnancy. Estrogen and progesterone usually decrease right after the delivery of your baby. The levels of thyroid hormone and various cortisol steroids also rapidly drop. Other factors that play a role in these mood changes include major life events and genetics. What increases the risk? If you have any of the following risks for the baby blues or postpartum depression, know what symptoms to watch out for during the postpartum period. Risk factors that may increase the likelihood of getting the baby blues or postpartum depression include:  Having a personal or family history of depression.  Having depression while being pregnant.  Having premenstrual mood issues or mood issues related to oral contraceptives.  Having a lot of life stress.  Having marital conflict.  Lacking a social support network.  Having a baby with special needs.  Having health problems, such as diabetes.  What are the signs or  symptoms? Symptoms of baby blues include:  Brief changes in mood, such as going from extreme happiness to sadness.  Decreased concentration.  Difficulty sleeping.  Crying spells, tearfulness.  Irritability.  Anxiety.  Symptoms of postpartum depression typically begin within the first month after giving birth. These symptoms include:  Difficulty sleeping or excessive sleepiness.  Marked weight loss.  Agitation.  Feelings of worthlessness.  Lack of interest in activity or food.  Postpartum psychosis is a very serious condition and can be dangerous. Fortunately, it is rare. Displaying any of the following symptoms is cause for immediate medical attention. Symptoms of postpartum psychosis include:  Hallucinations and delusions.  Bizarre or disorganized behavior.  Confusion or disorientation.  How is this diagnosed? A diagnosis is made by an evaluation of your symptoms. There are no medical or lab tests that lead to a diagnosis, but there are various questionnaires that a health care provider may use to identify those with the baby blues, postpartum depression, or psychosis. Often, a screening tool called the [place] Postnatal Depression Scale is used to diagnose depression in the postpartum period. How is this treated? The baby blues usually goes away on its own in 1-2 weeks. Social support is often all that is needed. You will be encouraged to get adequate sleep and rest. Occasionally, you may be given medicines to help you sleep. Postpartum depression requires treatment because it can last several months or longer if it is not treated. Treatment may include individual or group therapy, medicine, or both to address any social, physiological, and psychological factors that may play a role in the  depression. Regular exercise, a healthy diet, rest, and social support may also be strongly recommended. Postpartum psychosis is more serious and needs treatment right away.  Hospitalization is often needed. Follow these instructions at home:  Get as much rest as you can. Nap when the baby sleeps.  Exercise regularly. Some women find yoga and walking to be beneficial.  Eat a balanced and nourishing diet.  Do little things that you enjoy. Have a cup of tea, take a bubble bath, read your favorite magazine, or listen to your favorite music.  Avoid alcohol.  Ask for help with household chores, cooking, grocery shopping, or running errands as needed. Do not try to do everything.  Talk to people close to you about how you are feeling. Get support from your partner, family members, friends, or other new moms.  Try to stay positive in how you think. Think about the things you are grateful for.  Do not spend a lot of time alone.  Only take over-the-counter or prescription medicine as directed by your health care provider.  Keep all your postpartum appointments.  Let your health care provider know if you have any concerns. Contact a health care provider if: You are having a reaction to or problems with your medicine. Get help right away if:  You have suicidal feelings.  You think you may harm the baby or someone else. This information is not intended to replace advice given to you by your health care provider. Make sure you discuss any questions you have with your health care provider. Document Released: 01/16/2004 Document Revised: 09/19/2015 Document Reviewed: 01/23/2013 Elsevier Interactive Patient Education  2017 Elsevier Inc.  

## 2017-01-01 ENCOUNTER — Ambulatory Visit (INDEPENDENT_AMBULATORY_CARE_PROVIDER_SITE_OTHER): Payer: 59 | Admitting: Certified Nurse Midwife

## 2017-01-01 ENCOUNTER — Encounter: Payer: Self-pay | Admitting: Certified Nurse Midwife

## 2017-01-01 VITALS — BP 102/63 | HR 80 | Ht 59.0 in | Wt 126.2 lb

## 2017-01-01 DIAGNOSIS — O99345 Other mental disorders complicating the puerperium: Principal | ICD-10-CM

## 2017-01-01 DIAGNOSIS — F53 Postpartum depression: Secondary | ICD-10-CM

## 2017-01-01 NOTE — Progress Notes (Signed)
GYN ENCOUNTER NOTE  Subjective:       Alexis Snow is a 26 y.o. G65P1011 female is here for follow up visit. Diagnosed with postpartum depression on 12/18/2016 and started Zoloft 25 mg daily.   Pt doing well, "feels like the old Liberti again". Denies SI/HI. Would like to stay on Zoloft 25 mg daily at this time. Edinburgh Depression Scale score is 0.  Denies difficulty breathing or respiratory distress, chest pain, abdominal pain, excessive vaginal bleeding, and leg pain or swelling.     Gynecologic History  No LMP recorded.  Contraception: abstinence  Last Pap: 2017. Results were: normal  Obstetric History  OB History  Gravida Para Term Preterm AB Living  2       1    SAB TAB Ectopic Multiple Live Births    1          # Outcome Date GA Lbr Len/2nd Weight Sex Delivery Anes PTL Lv  2 Gravida           1 TAB 2013              Past Medical History:  Diagnosis Date  . Gestational diabetes   . Vitamin D deficiency     Past Surgical History:  Procedure Laterality Date  . CESAREAN SECTION N/A 12/09/2016   Procedure: CESAREAN SECTION;  Surgeon: Brayton Mars, MD;  Location: [facility] ORS;  Service: Obstetrics;  Laterality: N/A;  . TONSILLECTOMY AND ADENOIDECTOMY      Current Outpatient Prescriptions on File Prior to Visit  Medication Sig Dispense Refill  . Cholecalciferol (VITAMIN D3) 5000 units CAPS Take 1 capsule (5,000 Units total) by mouth daily. 120 capsule 2  . Iron-FA-B Cmp-C-Biot-Probiotic (FUSION PLUS) CAPS Take 1 capsule by mouth daily. 60 capsule 1  . Prenatal Vit-Fe Fumarate-FA (PRENATAL MULTIVITAMIN) TABS tablet Take 1 tablet by mouth daily at 12 noon.    . sertraline (ZOLOFT) 50 MG tablet Take 1 tablet (50 mg total) by mouth daily. (Patient taking differently: Take 50 mg by mouth daily. ) 30 tablet 3   No current facility-administered medications on file prior to visit.     No Known Allergies  Social History   Social History  . Marital status: Single     Spouse name: N/A  . Number of children: N/A  . Years of education: N/A   Occupational History  . Not on file.   Social History Main Topics  . Smoking status: Never Smoker  . Smokeless tobacco: Never Used  . Alcohol use No     Comment: wine - 3 x a week  . Drug use: No  . Sexual activity: Yes    Birth control/ protection: None   Other Topics Concern  . Not on file   Social History Narrative  . No narrative on file    Family History  Problem Relation Age of Onset  . Cancer Maternal Grandmother   . Heart disease Maternal Grandmother   . Diabetes Maternal Grandmother   . Cancer Maternal Grandfather   . Heart disease Paternal Grandfather   . Breast cancer Neg Hx   . Ovarian cancer Neg Hx   . Colon cancer Neg Hx     The following portions of the patient's history were reviewed and updated as appropriate: allergies, current medications, past family history, past medical history, past social history, past surgical history and problem list.  Review of Systems  Pertinent items noted in HPI and remainder of comprehensive ROS otherwise negative.  Objective:   BP 102/63 (BP Location: Left Arm, Patient Position: Sitting, Cuff Size: Normal)   Pulse 80   Ht 4\' 11"  (1.499 m)   Wt 126 lb 3.2 oz (57.2 kg)   Breastfeeding? Yes   BMI 25.49 kg/m   Alert and oriented x 4, no apparent distress.   Physical exam: not indicated.   Assessment:   1. Postpartum depression  Plan:   Continue Zoloft 25 mg daily.   Reviewed red flag symptoms and when to call.   RTC x 3 weeks for PPV.    Diona Fanti, CNM

## 2017-01-02 DIAGNOSIS — F53 Postpartum depression: Secondary | ICD-10-CM | POA: Insufficient documentation

## 2017-01-02 DIAGNOSIS — O99345 Other mental disorders complicating the puerperium: Principal | ICD-10-CM

## 2017-01-22 ENCOUNTER — Ambulatory Visit (INDEPENDENT_AMBULATORY_CARE_PROVIDER_SITE_OTHER): Payer: 59 | Admitting: Certified Nurse Midwife

## 2017-01-22 DIAGNOSIS — F53 Postpartum depression: Secondary | ICD-10-CM

## 2017-01-22 DIAGNOSIS — O99345 Other mental disorders complicating the puerperium: Secondary | ICD-10-CM

## 2017-01-22 DIAGNOSIS — Z8742 Personal history of other diseases of the female genital tract: Secondary | ICD-10-CM

## 2017-01-22 MED ORDER — SERTRALINE HCL 25 MG PO TABS: 25.0000 mg | ORAL_TABLET | Freq: Every day | ORAL | 5 refills | Status: DC

## 2017-01-22 NOTE — Patient Instructions (Signed)

## 2017-01-22 NOTE — Progress Notes (Signed)
Subjective:    Alexis Snow is a 26 y.o. G72P0010 Caucasian female who presents for a postpartum visit.   She is 6 weeks postpartum following a primary cesarean section, low transverse incision at 39+2 gestational weeks. Anesthesia: epidural and general. I have fully reviewed the prenatal and intrapartum course.   Postpartum course has been complicated by postpartum depression requiring medication, currently taking Zoloft 25 mg PO daily. Baby's course has been uncomplicated. Baby is feeding by breast. Bleeding no bleeding. Bowel function is normal. Bladder function is normal.   Patient is not sexually active.  Contraception method is abstinence. Postpartum depression screening: negative. Score 2.  Last pap 2017 and was normal.  The following portions of the patient's history were reviewed and updated as appropriate: allergies, current medications, past medical history, past surgical history and problem list.  Review of Systems  Pertinent items are noted in HPI.    Objective:   There were no vitals taken for this visit.  General:  alert, cooperative and no distress   Breasts:  deferred, no complaints  Lungs: clear to auscultation bilaterally  Heart:  regular rate and rhythm  Abdomen: soft, nontender   Vulva: normal  Vagina: normal vagina  Cervix:  closed  Corpus: Well-involuted  Adnexa:  Non-palpable            Edinburgh Postnatal Depression Scale - 01/22/17 1451      Edinburgh Postnatal Depression Scale:  In the Past 7 Days   I have been able to laugh and see the funny side of things. 0   I have looked forward with enjoyment to things. 0   I have blamed myself unnecessarily when things went wrong. 0   I have been anxious or worried for no good reason. 2   I have felt scared or panicky for no good reason. 0   Things have been getting on top of me. 0   I have been so unhappy that I have had difficulty sleeping. 0   I have felt sad or miserable. 0   I have been so unhappy  that I have been crying. 0   The thought of harming myself has occurred to me. 0   Edinburgh Postnatal Depression Scale Total 2     Assessment:   Postpartum exam Six (6) wks s/p primary c-section Breastfeeding Depression screening Contraception counseling   Plan:   Rx: Zoloft, see orders.   Plans condoms.  Follow up in: 6 months for medication check or earlier if needed   Diona Fanti, CNM

## 2017-02-04 DIAGNOSIS — Z8742 Personal history of other diseases of the female genital tract: Secondary | ICD-10-CM | POA: Insufficient documentation

## 2017-02-04 NOTE — Addendum Note (Signed)
Addended by: Cherre Huger on: 02/04/2017 12:40 PM   Modules accepted: Orders

## 2017-04-02 ENCOUNTER — Ambulatory Visit: Payer: 59

## 2017-06-11 ENCOUNTER — Encounter: Payer: 59 | Admitting: Physical Therapy

## 2017-06-15 ENCOUNTER — Telehealth: Payer: Self-pay | Admitting: *Deleted

## 2017-06-15 ENCOUNTER — Other Ambulatory Visit: Payer: Self-pay

## 2017-06-15 MED ORDER — SERTRALINE HCL 25 MG PO TABS
25.0000 mg | ORAL_TABLET | Freq: Every day | ORAL | 5 refills | Status: DC
Start: 2017-06-15 — End: 2021-10-14

## 2017-06-15 NOTE — Telephone Encounter (Signed)
Patient called and states she wants a refill of her Zoloft . Patient pharmacy is [facility] on [address]. Please advise. Thank you

## 2019-08-30 ENCOUNTER — Ambulatory Visit (INDEPENDENT_AMBULATORY_CARE_PROVIDER_SITE_OTHER): Payer: 59 | Admitting: Certified Nurse Midwife

## 2019-08-30 ENCOUNTER — Other Ambulatory Visit (HOSPITAL_COMMUNITY)
Admission: RE | Admit: 2019-08-30 | Discharge: 2019-08-30 | Disposition: A | Discharge status: Home / Self Care | Payer: 59 | Source: Ambulatory Visit | Attending: Certified Nurse Midwife | Admitting: Certified Nurse Midwife

## 2019-08-30 ENCOUNTER — Other Ambulatory Visit: Payer: Self-pay

## 2019-08-30 ENCOUNTER — Encounter: Payer: Self-pay | Admitting: Certified Nurse Midwife

## 2019-08-30 VITALS — BP 111/63 | HR 82 | Ht 60.0 in | Wt 133.3 lb

## 2019-08-30 DIAGNOSIS — F419 Anxiety disorder, unspecified: Secondary | ICD-10-CM | POA: Diagnosis not present

## 2019-08-30 DIAGNOSIS — Z01419 Encounter for gynecological examination (general) (routine) without abnormal findings: Secondary | ICD-10-CM | POA: Diagnosis not present

## 2019-08-30 DIAGNOSIS — Z124 Encounter for screening for malignant neoplasm of cervix: Secondary | ICD-10-CM | POA: Diagnosis not present

## 2019-08-30 DIAGNOSIS — N941 Unspecified dyspareunia: Secondary | ICD-10-CM

## 2019-08-30 NOTE — Progress Notes (Signed)
GYNECOLOGY ANNUAL PREVENTATIVE CARE ENCOUNTER NOTE  History:     Alexis Snow is a 29 y.o. G33P0010 female here for a routine annual gynecologic exam.  Current complaints: anxiety that is overwhelming PTSD, pain with intercourse and vaginal exams.Pt states she had a traumatic experience as a child and does not tolerate exam.    Denies abnormal vaginal bleeding, discharge, pelvic pain, problems with intercourse or other gynecologic concerns.     Social Relationship: husband  Living: spouse and child Work Exercise: occasional  Alcohol/drugs/smoking: denies   Gynecologic History No LMP recorded. Contraception: none Last Pap: 2017. Results were: normal  Last mammogram: n/a  Obstetric History OB History  Gravida Para Term Preterm AB Living  2       1    SAB TAB Ectopic Multiple Live Births    1          # Outcome Date GA Lbr Len/2nd Weight Sex Delivery Anes PTL Lv  2 Gravida           1 TAB 2013            Past Medical History:  Diagnosis Date  . Gestational diabetes   . Vitamin D deficiency     Past Surgical History:  Procedure Laterality Date  . CESAREAN SECTION N/A 12/09/2016   Procedure: CESAREAN SECTION;  Surgeon: Brayton Mars, MD;  Location: [facility] ORS;  Service: Obstetrics;  Laterality: N/A;  . TONSILLECTOMY AND ADENOIDECTOMY      Current Outpatient Medications on File Prior to Visit  Medication Sig Dispense Refill  . Cholecalciferol (VITAMIN D3) 5000 units CAPS Take 1 capsule (5,000 Units total) by mouth daily. 120 capsule 2  . Prenatal Vit-Fe Fumarate-FA (PRENATAL MULTIVITAMIN) TABS tablet Take 1 tablet by mouth daily at 12 noon.    . sertraline (ZOLOFT) 25 MG tablet Take 1 tablet (25 mg total) by mouth daily. 30 tablet 5   No current facility-administered medications on file prior to visit.    No Known Allergies  Social History:  reports that she has never smoked. She has never used smokeless tobacco. She reports that she does not drink alcohol  or use drugs.  Family History  Problem Relation Age of Onset  . Cancer Maternal Grandmother   . Heart disease Maternal Grandmother   . Diabetes Maternal Grandmother   . Cancer Maternal Grandfather   . Heart disease Paternal Grandfather   . Breast cancer Neg Hx   . Ovarian cancer Neg Hx   . Colon cancer Neg Hx     The following portions of the patient's history were reviewed and updated as appropriate: allergies, current medications, past family history, past medical history, past social history, past surgical history and problem list.  Review of Systems Pertinent items noted in HPI and remainder of comprehensive ROS otherwise negative.  Physical Exam:  There were no vitals taken for this visit. CONSTITUTIONAL: Well-developed, well-nourished female in no acute distress.  HENT:  Normocephalic, atraumatic, External right and left ear normal. Oropharynx is clear and moist EYES: Conjunctivae and EOM are normal. Pupils are equal, round, and reactive to light. No scleral icterus.  NECK: Normal range of motion, supple, no masses.  Normal thyroid.  SKIN: Skin is warm and dry. No rash noted. Not diaphoretic. No erythema. No pallor. MUSCULOSKELETAL: Normal range of motion. No tenderness.  No cyanosis, clubbing, or edema.  2+ distal pulses. NEUROLOGIC: Alert and oriented to person, place, and time. Normal reflexes, muscle tone coordination.  PSYCHIATRIC: Normal mood and affect. Normal behavior. Normal judgment and thought content. CARDIOVASCULAR: Normal heart rate noted, regular rhythm RESPIRATORY: Clear to auscultation bilaterally. Effort and breath sounds normal, no problems with respiration noted. BREASTS: Symmetric in size. No masses, tenderness, skin changes, nipple drainage, or lymphadenopathy bilaterally. Performed in the presence of a chaperone. ABDOMEN: Soft, no distention noted.  No tenderness, rebound or guarding.  PELVIC: Normal appearing external genitalia and urethral meatus;  normal appearing vaginal mucosa and cervix.  No abnormal discharge noted.  Pap smear obtained.Difficult to get due to pt not tolerating exam. Closing legs and raising up off of bed. Not able to do bimanual due to pt declines.  Performed in the presence of a chaperone.   Assessment and Plan:    1. Women's annual routine gynecological examination   Will follow up results of pap smear and manage accordingly. Mammogram not indicated Labs: pt declines Referral: pt for pelvic floor, psychiatry, and counseling  Refill: none Discussed use of xanax or valium prior to next pap smear or pelvic exam due to extreme discomfort and anxiety with exam.  Routine preventative health maintenance measures emphasized. Please refer to After Visit Summary for other counseling recommendations.      Philip Aspen, CNM

## 2019-08-30 NOTE — Addendum Note (Signed)
Addended by: Hildred Priest on: 08/30/2019 02:29 PM   Modules accepted: Orders

## 2019-08-30 NOTE — Patient Instructions (Signed)

## 2019-09-05 LAB — CYTOLOGY - PAP
Comment: NEGATIVE
Diagnosis: NEGATIVE
High risk HPV: NEGATIVE

## 2019-09-30 ENCOUNTER — Telehealth (HOSPITAL_COMMUNITY): Payer: Self-pay | Admitting: Psychiatry

## 2019-10-21 ENCOUNTER — Telehealth (HOSPITAL_COMMUNITY): Payer: Self-pay | Admitting: Psychiatry

## 2020-01-02 ENCOUNTER — Other Ambulatory Visit: Payer: Self-pay

## 2020-01-02 ENCOUNTER — Ambulatory Visit (INDEPENDENT_AMBULATORY_CARE_PROVIDER_SITE_OTHER): Payer: 59 | Admitting: Dermatology

## 2020-01-02 DIAGNOSIS — D229 Melanocytic nevi, unspecified: Secondary | ICD-10-CM

## 2020-01-02 DIAGNOSIS — L988 Other specified disorders of the skin and subcutaneous tissue: Secondary | ICD-10-CM | POA: Diagnosis not present

## 2020-01-02 DIAGNOSIS — D225 Melanocytic nevi of trunk: Secondary | ICD-10-CM

## 2020-01-02 DIAGNOSIS — D2261 Melanocytic nevi of right upper limb, including shoulder: Secondary | ICD-10-CM | POA: Diagnosis not present

## 2020-01-02 NOTE — Progress Notes (Signed)
   New Patient Visit  Subjective  Alexis Snow is a 29 y.o. female who presents for the following: nevus (on the right upper arm - has been changing in appearence, lighter and smaller), nevus (on the right breast - patient is concerned about it's appearence and would like it checked), and facial elastosis (patient has never had Botox and would like to discuss treatment option).  Mole on right arm present since childhood. Appeared darker and larger back then.  The following portions of the chart were reviewed this encounter and updated as appropriate:     Review of Systems:  No other skin or systemic complaints except as noted in HPI or Assessment and Plan.  Objective  Well appearing patient in no apparent distress; mood and affect are within normal limits.  A focused examination was performed including the right arm and right breast. Relevant physical exam findings are noted in the Assessment and Plan.  Objective  R upper arm: 0.8 x 0.6 cm flesh papule central brown pigment with emerging hair  Right lat breast: 0.5 cm fleshy brown papule  Images      Objective  Head - Anterior (Face): Rhytides and volume loss.    Assessment & Plan  Nevus (2) R upper arm; Right lat breast  Benign-appearing.  Observation.  Discussed shave removal if irritated.  Call clinic for new or changing moles.  Recommend daily use of broad spectrum spf 30+ sunscreen to sun-exposed areas.    OK to shave emerging hair, but do not recommend plucking it since that can cause irritation to the mole.  ABCDEs of mole observation discussed.  RTC if any changes noted.    Elastosis of skin Head - Anterior (Face)  Discussed and recommend Botox: - 20 units to the frown complex  - 5 units to the forehead  - 5 units to each crow's feet   Advised patient that Botox lasts about 3-4 months, costs $13 per unit.   Return in about 1 month (around 02/01/2020) for nevus recheck and Botox .  Luther Redo, CMA,  am acting as scribe for Brendolyn Patty, MD .  Documentation: I have reviewed the above documentation for accuracy and completeness, and I agree with the above.  Brendolyn Patty MD

## 2020-01-02 NOTE — Patient Instructions (Signed)

## 2020-02-14 ENCOUNTER — Ambulatory Visit: Payer: 59 | Admitting: Dermatology

## 2020-04-29 ENCOUNTER — Ambulatory Visit: Payer: 59 | Admitting: Dermatology

## 2020-05-08 ENCOUNTER — Ambulatory Visit: Payer: 59 | Admitting: Dermatology

## 2020-05-29 ENCOUNTER — Ambulatory Visit (INDEPENDENT_AMBULATORY_CARE_PROVIDER_SITE_OTHER): Payer: 59 | Admitting: Dermatology

## 2020-05-29 ENCOUNTER — Other Ambulatory Visit: Payer: Self-pay

## 2020-05-29 DIAGNOSIS — D229 Melanocytic nevi, unspecified: Secondary | ICD-10-CM | POA: Diagnosis not present

## 2020-05-29 DIAGNOSIS — D2261 Melanocytic nevi of right upper limb, including shoulder: Secondary | ICD-10-CM | POA: Diagnosis not present

## 2020-05-29 DIAGNOSIS — L814 Other melanin hyperpigmentation: Secondary | ICD-10-CM

## 2020-05-29 DIAGNOSIS — D225 Melanocytic nevi of trunk: Secondary | ICD-10-CM | POA: Diagnosis not present

## 2020-05-29 NOTE — Patient Instructions (Signed)

## 2020-05-29 NOTE — Progress Notes (Signed)
   Follow-Up Visit   Subjective  Alexis Snow is a 30 y.o. female who presents for the following: Nevus (Right lat breast, right upper arm - would just like them rechecked. She will schedule a total body exam later.).    The following portions of the chart were reviewed this encounter and updated as appropriate:       Review of Systems:  No other skin or systemic complaints except as noted in HPI or Assessment and Plan.  Objective  Well appearing patient in no apparent distress; mood and affect are within normal limits.  A focused examination was performed including right upper arm. Relevant physical exam findings are noted in the Assessment and Plan.  Objective  Right Breast, Right Upper Arm: 0.8 x 0.6 cm flesh papule with central brown portion and emerging hair- right upper arm.  Photo compared- stable  5 mm pink brown papule- right lat breast.   Assessment & Plan    Lentigines - Scattered tan macules - Discussed due to sun exposure - Benign, observe - Recommend daily broad spectrum sunscreen SPF 30+ to sun-exposed areas, reapply every 2 hours as needed. - Call for any changes  Melanocytic Nevi - Tan-brown and/or pink-flesh-colored symmetric macules and papules - Benign appearing on exam today - Observation - Call clinic for new or changing moles - Recommend daily use of broad spectrum spf 30+ sunscreen to sun-exposed areas.    Nevus (2) Right Upper Arm; Right Breast  Benign-appearing.  Stable today compared to phot from 12/2018. Observation.  Discussed shave removal if irritated.  Call clinic for new or changing moles.  Recommend daily use of broad spectrum spf 30+ sunscreen to sun-exposed areas.    May plan shave removal of nevus of right upper arm at her next appointment.  Discussed resulting scar.  Return for TBSE.  I, Ashok Cordia, CMA, am acting as scribe for Brendolyn Patty, MD .  Documentation: I have reviewed the above documentation for accuracy and  completeness, and I agree with the above.  Brendolyn Patty MD

## 2020-06-11 ENCOUNTER — Encounter: Payer: 59 | Admitting: Certified Nurse Midwife

## 2020-08-21 ENCOUNTER — Other Ambulatory Visit (HOSPITAL_COMMUNITY): Payer: Self-pay | Admitting: Student

## 2020-08-21 ENCOUNTER — Other Ambulatory Visit: Payer: Self-pay | Admitting: Student

## 2020-08-21 DIAGNOSIS — R1011 Right upper quadrant pain: Secondary | ICD-10-CM

## 2020-08-27 ENCOUNTER — Other Ambulatory Visit: Payer: Self-pay

## 2020-08-27 ENCOUNTER — Ambulatory Visit (HOSPITAL_COMMUNITY)
Admission: RE | Admit: 2020-08-27 | Discharge: 2020-08-27 | Disposition: A | Discharge status: Home / Self Care | Payer: 59 | Source: Ambulatory Visit | Attending: Student | Admitting: Student

## 2020-08-27 DIAGNOSIS — R1011 Right upper quadrant pain: Secondary | ICD-10-CM | POA: Insufficient documentation

## 2020-09-02 ENCOUNTER — Ambulatory Visit: Payer: 59 | Admitting: Dermatology

## 2020-11-05 ENCOUNTER — Other Ambulatory Visit: Payer: Self-pay | Admitting: Gastroenterology

## 2020-11-05 ENCOUNTER — Other Ambulatory Visit (HOSPITAL_COMMUNITY): Payer: Self-pay | Admitting: Gastroenterology

## 2020-11-06 ENCOUNTER — Telehealth: Payer: Self-pay | Admitting: Radiology

## 2020-11-06 ENCOUNTER — Other Ambulatory Visit: Payer: Self-pay | Admitting: Gastroenterology

## 2020-11-06 DIAGNOSIS — K760 Fatty (change of) liver, not elsewhere classified: Secondary | ICD-10-CM

## 2020-11-28 ENCOUNTER — Ambulatory Visit: Payer: 59 | Admitting: Anesthesiology

## 2020-11-28 ENCOUNTER — Encounter: Payer: Self-pay | Admitting: Anesthesiology

## 2020-11-28 ENCOUNTER — Encounter
Admission: RE | Disposition: A | Discharge status: Home / Self Care | Payer: Self-pay | Source: Ambulatory Visit | Attending: Gastroenterology

## 2020-11-28 ENCOUNTER — Ambulatory Visit
Admission: RE | Admit: 2020-11-28 | Discharge: 2020-11-28 | Disposition: A | Discharge status: Home / Self Care | Payer: 59 | Source: Ambulatory Visit | Attending: Gastroenterology | Admitting: Gastroenterology

## 2020-11-28 DIAGNOSIS — Z79899 Other long term (current) drug therapy: Secondary | ICD-10-CM | POA: Insufficient documentation

## 2020-11-28 DIAGNOSIS — K5900 Constipation, unspecified: Secondary | ICD-10-CM | POA: Diagnosis not present

## 2020-11-28 DIAGNOSIS — R1013 Epigastric pain: Secondary | ICD-10-CM | POA: Insufficient documentation

## 2020-11-28 DIAGNOSIS — K2970 Gastritis, unspecified, without bleeding: Secondary | ICD-10-CM | POA: Insufficient documentation

## 2020-11-28 DIAGNOSIS — R6881 Early satiety: Secondary | ICD-10-CM | POA: Insufficient documentation

## 2020-11-28 HISTORY — PX: ESOPHAGOGASTRODUODENOSCOPY (EGD) WITH PROPOFOL: SHX5813

## 2020-11-28 HISTORY — DX: Postpartum depression: F53.0

## 2020-11-28 HISTORY — DX: Obsessive-compulsive disorder, unspecified: F42.9

## 2020-11-28 HISTORY — DX: Anxiety disorder, unspecified: F41.9

## 2020-11-28 SURGERY — ESOPHAGOGASTRODUODENOSCOPY (EGD) WITH PROPOFOL
Anesthesia: General

## 2020-11-28 MED ORDER — LIDOCAINE HCL (CARDIAC) PF 100 MG/5ML IV SOSY
PREFILLED_SYRINGE | INTRAVENOUS | Status: DC | PRN
Start: 2020-11-28 — End: 2020-11-28
  Administered 2020-11-28: 50 mg via INTRAVENOUS

## 2020-11-28 MED ORDER — SODIUM CHLORIDE 0.9 % IV SOLN: INTRAVENOUS | Status: DC

## 2020-11-28 MED ORDER — PROPOFOL 500 MG/50ML IV EMUL
INTRAVENOUS | Status: AC
  Filled 2020-11-28: qty 50

## 2020-11-28 MED ORDER — PROPOFOL 500 MG/50ML IV EMUL
INTRAVENOUS | Status: DC | PRN
  Administered 2020-11-28: 180 ug/kg/min via INTRAVENOUS

## 2020-11-28 MED ORDER — LIDOCAINE HCL (PF) 2 % IJ SOLN
INTRAMUSCULAR | Status: AC
  Filled 2020-11-28: qty 5

## 2020-11-28 NOTE — Transfer of Care (Signed)
Immediate Anesthesia Transfer of Care Note  Patient: Alexis Snow  Procedure(s) Performed: ESOPHAGOGASTRODUODENOSCOPY (EGD) WITH PROPOFOL  Patient Location: PACU  Anesthesia Type:General  Level of Consciousness: awake and sedated  Airway & Oxygen Therapy: Patient Spontanous Breathing and Patient connected to nasal cannula oxygen  Post-op Assessment: Report given to RN and Post -op Vital signs reviewed and stable  Post vital signs: Reviewed and stable  Last Vitals:  Vitals Value Taken Time  BP    Temp    Pulse    Resp    SpO2      Last Pain:  Vitals:   11/28/20 1300  TempSrc: Temporal  PainSc: 0-No pain         Complications: No notable events documented.

## 2020-11-28 NOTE — Op Note (Signed)
[facility] Patient Name: Alexis Snow Procedure Date: 11/28/2020 2:01 PM MRN: [id number] Account #: [id number] Date of Birth: [birth year] Admit Type: Outpatient Age: 30 Room: [facility] ENDO ROOM 2 Gender: Female Note Status: Finalized Procedure:             Upper GI endoscopy Indications:           Dyspepsia, Early satiety Providers:             Andrey Farmer MD, MD Referring MD:          No Local Md, MD (Referring MD) Medicines:             Monitored Anesthesia Care Complications:         No immediate complications. Estimated blood loss:                         Minimal. Procedure:             Pre-Anesthesia Assessment:                        - Prior to the procedure, a History and Physical was                         performed, and patient medications and allergies were                         reviewed. The patient is competent. The risks and                         benefits of the procedure and the sedation options and                         risks were discussed with the patient. All questions                         were answered and informed consent was obtained.                         Patient identification and proposed procedure were                         verified by the physician, the nurse, the anesthetist                         and the technician in the endoscopy suite. Mental                         Status Examination: alert and oriented. Airway                         Examination: normal oropharyngeal airway and neck                         mobility. Respiratory Examination: clear to                         auscultation. CV Examination: normal. Prophylactic  Antibiotics: The patient does not require prophylactic                         antibiotics. Prior Anticoagulants: The patient has                         taken no previous anticoagulant or antiplatelet                         agents. ASA Grade Assessment: I -  A normal, healthy                         patient. After reviewing the risks and benefits, the                         patient was deemed in satisfactory condition to                         undergo the procedure. The anesthesia plan was to use                         monitored anesthesia care (MAC). Immediately prior to                         administration of medications, the patient was                         re-assessed for adequacy to receive sedatives. The                         heart rate, respiratory rate, oxygen saturations,                         blood pressure, adequacy of pulmonary ventilation, and                         response to care were monitored throughout the                         procedure. The physical status of the patient was                         re-assessed after the procedure.                        After obtaining informed consent, the endoscope was                         passed under direct vision. Throughout the procedure,                         the patient's blood pressure, pulse, and oxygen                         saturations were monitored continuously. The Endoscope                         was introduced through the mouth, and advanced to the  second part of duodenum. The upper GI endoscopy was                         accomplished without difficulty. The patient tolerated                         the procedure well. Findings:      The examined esophagus was normal.      Patchy minimal inflammation characterized by erythema was found in the       gastric antrum. Biopsies were taken with a cold forceps for Helicobacter       pylori testing. Estimated blood loss was minimal.      The examined duodenum was normal. Biopsies for histology were taken with       a cold forceps for evaluation of celiac disease. Estimated blood loss       was minimal. Impression:            - Normal esophagus.                        - Gastritis.  Biopsied.                        - Normal examined duodenum. Biopsied. Recommendation:        - Discharge patient to home.                        - Resume previous diet.                        - Continue present medications.                        - Await pathology results.                        - Return to referring physician as previously                         scheduled. Procedure Code(s):     --- Professional ---                        [phone], Esophagogastroduodenoscopy, flexible,                         transoral; with biopsy, single or multiple Diagnosis Code(s):     --- Professional ---                        K29.70, Gastritis, unspecified, without bleeding                        R10.13, Epigastric pain                        R68.81, Early satiety CPT copyright 2019 American Medical Association. All rights reserved. The codes documented in this report are preliminary and upon coder review may  be revised to meet current compliance requirements. Andrey Farmer MD, MD 11/28/2020 2:16:14 PM Number of Addenda: 0 Note Initiated On: 11/28/2020 2:01 PM Estimated Blood Loss:  Estimated blood loss was minimal.      [facility]

## 2020-11-28 NOTE — Interval H&P Note (Signed)
History and Physical Interval Note:  11/28/2020 1:38 PM  Alexis Snow  has presented today for surgery, with the diagnosis of Gastroesophageal reflux disease, unspecified whether esophagitis present (K21.9) Epigastric pain (R10.13).  The various methods of treatment have been discussed with the patient and family. After consideration of risks, benefits and other options for treatment, the patient has consented to  Procedure(s): ESOPHAGOGASTRODUODENOSCOPY (EGD) WITH PROPOFOL (N/A) as a surgical intervention.  The patient's history has been reviewed, patient examined, no change in status, stable for surgery.  I have reviewed the patient's chart and labs.  Questions were answered to the patient's satisfaction.     [name]  Ok to proceed with EGD

## 2020-11-28 NOTE — Anesthesia Preprocedure Evaluation (Signed)
Anesthesia Evaluation  Patient identified by MRN, date of birth, ID band Patient awake    Reviewed: Allergy & Precautions, H&P , NPO status , Patient's Chart, lab work & pertinent test results, reviewed documented beta blocker date and time   History of Anesthesia Complications Negative for: history of anesthetic complications  Airway Mallampati: II  TM Distance: >3 FB Neck ROM: full    Dental  (+) Dental Advidsory Given, Teeth Intact   Pulmonary neg pulmonary ROS,    Pulmonary exam normal breath sounds clear to auscultation       Cardiovascular Exercise Tolerance: Good negative cardio ROS Normal cardiovascular exam Rhythm:regular Rate:Normal     Neuro/Psych PSYCHIATRIC DISORDERS Anxiety Depression negative neurological ROS     GI/Hepatic GERD  ,NAFLD   Endo/Other  negative endocrine ROS  Renal/GU negative Renal ROS  negative genitourinary   Musculoskeletal   Abdominal   Peds  Hematology negative hematology ROS (+)   Anesthesia Other Findings Past Medical History: No date: Anxiety No date: Gestational diabetes No date: OCD (obsessive compulsive disorder) No date: Postpartum depression No date: Vitamin D deficiency   Reproductive/Obstetrics negative OB ROS                             Anesthesia Physical Anesthesia Plan  ASA: 2  Anesthesia Plan: General   Post-op Pain Management:    Induction: Intravenous  PONV Risk Score and Plan: 3 and TIVA and Propofol infusion  Airway Management Planned: Natural Airway and Nasal Cannula  Additional Equipment:   Intra-op Plan:   Post-operative Plan:   Informed Consent: I have reviewed the patients History and Physical, chart, labs and discussed the procedure including the risks, benefits and alternatives for the proposed anesthesia with the patient or authorized representative who has indicated his/her understanding and acceptance.      Dental Advisory Given  Plan Discussed with: Anesthesiologist, CRNA and Surgeon  Anesthesia Plan Comments:         Anesthesia Quick Evaluation

## 2020-11-28 NOTE — H&P (Signed)
Outpatient short stay form Pre-procedure 11/28/2020 1:32 PM Raylene Miyamoto MD, MPH  Primary Physician: PA Eulas Post  Reason for visit:  Dyspepsia  History of present illness:   30 y/o lady with dyspepsia and early satiety here for EGD. No blood thinners. Has been having constipation with abdominal pain that might improve with bowel movements. No significant family history of GI issues.    Current Facility-Administered Medications:    0.9 %  sodium chloride infusion, , Intravenous, Continuous, Preethi Scantlebury, Hilton Cork, MD, Last Rate: 20 mL/hr at 11/28/20 1309, New Bag at 11/28/20 1309  Medications Prior to Admission  Medication Sig Dispense Refill Last Dose   Cholecalciferol (VITAMIN D3) 5000 units CAPS Take 1 capsule (5,000 Units total) by mouth daily. (Patient not taking: Reported on 01/02/2020) 120 capsule 2    Prenatal Vit-Fe Fumarate-FA (PRENATAL MULTIVITAMIN) TABS tablet Take 1 tablet by mouth daily at 12 noon. (Patient not taking: Reported on 01/02/2020)      sertraline (ZOLOFT) 25 MG tablet Take 1 tablet (25 mg total) by mouth daily. (Patient not taking: Reported on 08/30/2019) 30 tablet 5      No Known Allergies   Past Medical History:  Diagnosis Date   Anxiety    Gestational diabetes    OCD (obsessive compulsive disorder)    Postpartum depression    Vitamin D deficiency     Review of systems:  Otherwise negative.    Physical Exam  Gen: Alert, oriented. Appears stated age.  HEENT: PERRLA. Lungs: No respiratory distress CV: RRR Abd: soft, benign, no masses Ext: No edema    Planned procedures: Proceed with EGD. The patient understands the nature of the planned procedure, indications, risks, alternatives and potential complications including but not limited to bleeding, infection, perforation, damage to internal organs and possible oversedation/side effects from anesthesia. The patient agrees and gives consent to proceed.  Please refer to procedure notes for findings,  recommendations and patient disposition/instructions.     Raylene Miyamoto MD, MPH Gastroenterology 11/28/2020  1:32 PM

## 2020-11-28 NOTE — Anesthesia Postprocedure Evaluation (Signed)
Anesthesia Post Note  Patient: Alexis Snow  Procedure(s) Performed: ESOPHAGOGASTRODUODENOSCOPY (EGD) WITH PROPOFOL  Patient location during evaluation: Endoscopy Anesthesia Type: General Level of consciousness: awake and alert Pain management: pain level controlled Vital Signs Assessment: post-procedure vital signs reviewed and stable Respiratory status: spontaneous breathing, nonlabored ventilation, respiratory function stable and patient connected to nasal cannula oxygen Cardiovascular status: blood pressure returned to baseline and stable Postop Assessment: no apparent nausea or vomiting Anesthetic complications: no   No notable events documented.   Last Vitals:  Vitals:   11/28/20 1430 11/28/20 1440  BP: 98/82   Pulse: 78 90  Resp: 15 13  Temp:    SpO2: 100% 100%    Last Pain:  Vitals:   11/28/20 1300  TempSrc: Temporal  PainSc: 0-No pain                 Martha Clan

## 2020-11-28 NOTE — Interval H&P Note (Signed)
History and Physical Interval Note:  11/28/2020 1:36 PM  Alexis Snow  has presented today for surgery, with the diagnosis of Gastroesophageal reflux disease, unspecified whether esophagitis present (K21.9) Epigastric pain (R10.13).  The various methods of treatment have been discussed with the patient and family. After consideration of risks, benefits and other options for treatment, the patient has consented to  Procedure(s): ESOPHAGOGASTRODUODENOSCOPY (EGD) WITH PROPOFOL (N/A) as a surgical intervention.  The patient's history has been reviewed, patient examined, no change in status, stable for surgery.  I have reviewed the patient's chart and labs.  Questions were answered to the patient's satisfaction.     [name]  Ok to proceed with EGD

## 2020-11-28 NOTE — Anesthesia Procedure Notes (Signed)
Date/Time: 11/28/2020 2:10 PM Performed by: Vaughan Sine Pre-anesthesia Checklist: Patient identified, Emergency Drugs available, Suction available, Patient being monitored and Timeout performed Patient Re-evaluated:Patient Re-evaluated prior to induction Oxygen Delivery Method: Nasal cannula Preoxygenation: Pre-oxygenation with 100% oxygen Induction Type: IV induction Airway Equipment and Method: Bite block Placement Confirmation: positive ETCO2 and CO2 detector

## 2020-12-02 LAB — SURGICAL PATHOLOGY

## 2020-12-03 LAB — POCT PREGNANCY, URINE: Preg Test, Ur: NEGATIVE

## 2021-01-29 ENCOUNTER — Ambulatory Visit (INDEPENDENT_AMBULATORY_CARE_PROVIDER_SITE_OTHER): Payer: 59 | Admitting: Certified Nurse Midwife

## 2021-01-29 ENCOUNTER — Other Ambulatory Visit: Payer: Self-pay

## 2021-01-29 ENCOUNTER — Encounter: Payer: Self-pay | Admitting: Certified Nurse Midwife

## 2021-01-29 VITALS — BP 94/68 | HR 73 | Ht 60.0 in | Wt 134.6 lb

## 2021-01-29 DIAGNOSIS — F4310 Post-traumatic stress disorder, unspecified: Secondary | ICD-10-CM | POA: Diagnosis not present

## 2021-01-29 DIAGNOSIS — Z3200 Encounter for pregnancy test, result unknown: Secondary | ICD-10-CM | POA: Diagnosis not present

## 2021-01-29 DIAGNOSIS — F429 Obsessive-compulsive disorder, unspecified: Secondary | ICD-10-CM | POA: Insufficient documentation

## 2021-01-29 DIAGNOSIS — F422 Mixed obsessional thoughts and acts: Secondary | ICD-10-CM

## 2021-01-29 DIAGNOSIS — F419 Anxiety disorder, unspecified: Secondary | ICD-10-CM | POA: Insufficient documentation

## 2021-01-29 DIAGNOSIS — Z01419 Encounter for gynecological examination (general) (routine) without abnormal findings: Secondary | ICD-10-CM

## 2021-01-29 DIAGNOSIS — Z8659 Personal history of other mental and behavioral disorders: Secondary | ICD-10-CM

## 2021-01-29 LAB — POCT URINE PREGNANCY: Preg Test, Ur: NEGATIVE

## 2021-01-29 MED ORDER — DIAZEPAM 5 MG PO TABS: 5.0000 mg | ORAL_TABLET | Freq: Four times a day (QID) | ORAL | 0 refills | Status: DC | PRN

## 2021-01-29 NOTE — Progress Notes (Signed)
GYNECOLOGY ANNUAL PREVENTATIVE CARE ENCOUNTER NOTE  History:     Alexis Snow is a 30 y.o. G31P1011 female here for a routine annual gynecologic exam.  Current complaints: concern about possible HPV explsure. States her husband was recently dx with HPV warts.  Would like to have her pap repeated but has signfigant anxiety PTSD due to hx of events in the past.  Denies abnormal vaginal bleeding, discharge, pelvic pain, problems with intercourse or other gynecologic concerns.     Social Relationship: Married Living: with her spouse and [place]  Work: Radiographer, therapeutic Exercise: none Smoke/Alcohol/drug use: occasional alcohol use, denies drugs and smoking  Gynecologic History Patient's last menstrual period was 01/01/2021 (exact date). Contraception: none Last Pap: 08/30/2019. Results were: normal with negative HPV Last mammogram: n/a.   The pregnancy intention screening data noted above was reviewed. Potential methods of contraception were discussed. The patient elected to proceed with none.   Obstetric History OB History  Gravida Para Term Preterm AB Living  2 1 1   1 1   SAB IAB Ectopic Multiple Live Births    1     1    # Outcome Date GA Lbr Len/2nd Weight Sex Delivery Anes PTL Lv  2 Term 12/09/16 [redacted]w[redacted]d  8 lb (3.629 kg) M CS-Unspec  N LIV     Complications: Shoulder Dystocia  1 IAB 2013            Past Medical History:  Diagnosis Date   Anxiety    Gestational diabetes    Liver disease    OCD (obsessive compulsive disorder)    Postpartum depression    Vitamin D deficiency     Past Surgical History:  Procedure Laterality Date   CESAREAN SECTION N/A 12/09/2016   Procedure: CESAREAN SECTION;  Surgeon: Brayton Mars, MD;  Location: [facility] ORS;  Service: Obstetrics;  Laterality: N/A;   ESOPHAGOGASTRODUODENOSCOPY (EGD) WITH PROPOFOL N/A 11/28/2020   Procedure: ESOPHAGOGASTRODUODENOSCOPY (EGD) WITH PROPOFOL;  Surgeon: [name], MD;  Location: [facility] ENDOSCOPY;   Service: Endoscopy;  Laterality: N/A;   TONSILLECTOMY AND ADENOIDECTOMY      Current Outpatient Medications on File Prior to Visit  Medication Sig Dispense Refill   Probiotic Product (PROBIOTIC PO) Take by mouth.     Cholecalciferol (VITAMIN D3) 5000 units CAPS Take 1 capsule (5,000 Units total) by mouth daily. (Patient not taking: No sig reported) 120 capsule 2   Prenatal Vit-Fe Fumarate-FA (PRENATAL MULTIVITAMIN) TABS tablet Take 1 tablet by mouth daily at 12 noon. (Patient not taking: No sig reported)     sertraline (ZOLOFT) 25 MG tablet Take 1 tablet (25 mg total) by mouth daily. (Patient not taking: No sig reported) 30 tablet 5   No current facility-administered medications on file prior to visit.    No Known Allergies  Social History:  reports that she has never smoked. She has never used smokeless tobacco. She reports current alcohol use. She reports that she does not use drugs.  Family History  Problem Relation Age of Onset   Cancer Maternal Grandmother    Heart disease Maternal Grandmother    Diabetes Maternal Grandmother    Liver disease Maternal Grandmother    Cancer Maternal Grandfather    Heart disease Paternal Grandfather    Breast cancer Neg Hx    Ovarian cancer Neg Hx    Colon cancer Neg Hx     The following portions of the patient's history were reviewed and updated as appropriate: allergies, current  medications, past family history, past medical history, past social history, past surgical history and problem list.  Review of Systems Pertinent items noted in HPI and remainder of comprehensive ROS otherwise negative.  Physical Exam:  BP 94/68   Pulse 73   Ht 5' (1.524 m)   Wt 134 lb 9.6 oz (61.1 kg)   LMP 01/01/2021 (Exact Date)   BMI 26.29 kg/m  CONSTITUTIONAL: Well-developed, well-nourished female in no acute distress.  HENT:  Normocephalic, atraumatic, External right and left ear normal. Oropharynx is clear and moist EYES: Conjunctivae and EOM are  normal. Pupils are equal, round, and reactive to light. No scleral icterus.  NECK: Normal range of motion, supple, no masses.  Normal thyroid.  SKIN: Skin is warm and dry. No rash noted. Not diaphoretic. No erythema. No pallor. MUSCULOSKELETAL: Normal range of motion. No tenderness.  No cyanosis, clubbing, or edema.  2+ distal pulses. NEUROLOGIC: Alert and oriented to person, place, and time. Normal reflexes, muscle tone coordination.  PSYCHIATRIC: Normal mood and affect. Normal behavior. Normal judgment and thought content. CARDIOVASCULAR: Normal heart rate noted, regular rhythm RESPIRATORY: Clear to auscultation bilaterally. Effort and breath sounds normal, no problems with respiration noted. BREASTS: Symmetric in size. No masses, tenderness, skin changes, nipple drainage, or lymphadenopathy bilaterally.  ABDOMEN: Soft, no distention noted.  No tenderness, rebound or guarding.  PELVIC: Normal appearing external genitalia and urethral meatus; normal appearing vaginal mucosa and cervix.  No abnormal discharge noted.  Pap smear obtained.  Normal uterine size, no other palpable masses, no uterine or adnexal tenderness.  .   Assessment and Plan:    1. Well woman exam with routine gynecological exam   2. Possible pregnancy, not yet confirmed - POCT urine pregnancy   Pap: she would like repeat pap done but is requesting medication for anxiety prior to having done. Will follow up for pap only.  Mammogram : none Labs: none Refills: none Orders Valium 5 mg tablet prior to pap  Referral: behavioral health  Routine preventative health maintenance measures emphasized. Please refer to After Visit Summary for other counseling recommendations.      Philip Aspen, CNM [facility]

## 2021-01-29 NOTE — Progress Notes (Signed)
Pt declines flu

## 2021-02-12 ENCOUNTER — Other Ambulatory Visit: Payer: Self-pay

## 2021-02-12 ENCOUNTER — Ambulatory Visit
Admission: RE | Admit: 2021-02-12 | Discharge: 2021-02-12 | Disposition: A | Discharge status: Home / Self Care | Payer: 59 | Source: Ambulatory Visit | Attending: Gastroenterology | Admitting: Gastroenterology

## 2021-02-12 DIAGNOSIS — K760 Fatty (change of) liver, not elsewhere classified: Secondary | ICD-10-CM | POA: Diagnosis not present

## 2021-10-12 ENCOUNTER — Encounter: Payer: Self-pay | Admitting: *Deleted

## 2021-10-12 ENCOUNTER — Ambulatory Visit
Admission: EM | Admit: 2021-10-12 | Discharge: 2021-10-12 | Disposition: A | Discharge status: Home / Self Care | Payer: 59 | Attending: Emergency Medicine | Admitting: Emergency Medicine

## 2021-10-12 ENCOUNTER — Other Ambulatory Visit: Payer: Self-pay

## 2021-10-12 DIAGNOSIS — R1030 Lower abdominal pain, unspecified: Secondary | ICD-10-CM | POA: Diagnosis not present

## 2021-10-12 DIAGNOSIS — R300 Dysuria: Secondary | ICD-10-CM | POA: Diagnosis not present

## 2021-10-12 DIAGNOSIS — R809 Proteinuria, unspecified: Secondary | ICD-10-CM

## 2021-10-12 LAB — POCT URINALYSIS DIP (MANUAL ENTRY)
Bilirubin, UA: NEGATIVE
Blood, UA: NEGATIVE
Glucose, UA: NEGATIVE mg/dL
Ketones, POC UA: NEGATIVE mg/dL
Leukocytes, UA: NEGATIVE
Nitrite, UA: NEGATIVE
Protein Ur, POC: 30 mg/dL — AB
Spec Grav, UA: 1.015 (ref 1.010–1.025)
Urobilinogen, UA: 0.2 E.U./dL
pH, UA: 8.5 — AB (ref 5.0–8.0)

## 2021-10-12 LAB — POCT URINE PREGNANCY: Preg Test, Ur: NEGATIVE

## 2021-10-12 NOTE — ED Provider Notes (Signed)
Roderic Palau    CSN: [id number] Arrival date & time: 10/12/21  1001      History   Chief Complaint Chief Complaint  Patient presents with   Abdominal Pain    bladder pain - Entered by patient   Dysuria   Urinary Urgency    HPI Alexis Snow is a 31 y.o. female.  Patient presents with lower abdominal pain, dysuria, urinary frequency today.  The lower abdominal pain is sharp and intermittent, primarily in the right lower quadrant; none currently.  She denies fever, chills, nausea, vomiting, diarrhea, constipation, vaginal discharge, pelvic pain, flank pain, chest pain, shortness of breath, or other symptoms.  No treatments at home.  Her medical history includes liver disease, obsessive-compulsive disorder, anxiety, posttraumatic stress.  The history is provided by the patient and medical records.    Past Medical History:  Diagnosis Date   Anxiety    Gestational diabetes    Liver disease    OCD (obsessive compulsive disorder)    Postpartum depression    Vitamin D deficiency     Patient Active Problem List   Diagnosis Date Noted   Anxiety 01/29/2021   Post-traumatic stress 01/29/2021   OCD (obsessive compulsive disorder) 01/29/2021   History of dyspareunia in female 02/04/2017   Status post C-section 12/09/2016    Past Surgical History:  Procedure Laterality Date   CESAREAN SECTION N/A 12/09/2016   Procedure: CESAREAN SECTION;  Surgeon: Brayton Mars, MD;  Location: [facility];  Service: Obstetrics;  Laterality: N/A;   ESOPHAGOGASTRODUODENOSCOPY (EGD) WITH PROPOFOL N/A 11/28/2020   Procedure: ESOPHAGOGASTRODUODENOSCOPY (EGD) WITH PROPOFOL;  Surgeon: [name], MD;  Location: [facility];  Service: Endoscopy;  Laterality: N/A;   TONSILLECTOMY AND ADENOIDECTOMY      OB History     Gravida  2   Para  1   Term  1   Preterm      AB  1   Living  1      SAB      IAB  1   Ectopic      Multiple      Live Births  1             Home Medications    Prior to Admission medications   Medication Sig Start Date End Date Taking? Authorizing Provider  Cholecalciferol (VITAMIN D3) 5000 units CAPS Take 1 capsule (5,000 Units total) by mouth daily. Patient not taking: No sig reported 12/11/16   Shambley, Melody N, CNM  diazepam (VALIUM) 5 MG tablet Take 1 tablet (5 mg total) by mouth every 6 (six) hours as needed for anxiety. 01/29/21   Philip Aspen, CNM  Prenatal Vit-Fe Fumarate-FA (PRENATAL MULTIVITAMIN) TABS tablet Take 1 tablet by mouth daily at 12 noon. Patient not taking: No sig reported    [provider]  Probiotic Product (PROBIOTIC PO) Take by mouth.    [provider]  sertraline (ZOLOFT) 25 MG tablet Take 1 tablet (25 mg total) by mouth daily. Patient not taking: No sig reported 06/15/17   Diona Fanti, CNM    Family History Family History  Problem Relation Age of Onset   Cancer Maternal Grandmother    Heart disease Maternal Grandmother    Diabetes Maternal Grandmother    Liver disease Maternal Grandmother    Cancer Maternal Grandfather    Heart disease Paternal Grandfather    Breast cancer Neg Hx    Ovarian cancer Neg Hx    Colon cancer Neg  Hx     Social History Social History   Tobacco Use   Smoking status: Never   Smokeless tobacco: Never  Vaping Use   Vaping Use: Never used  Substance Use Topics   Alcohol use: Yes    Comment: occass   Drug use: No     Allergies   Patient has no known allergies.   Review of Systems Review of Systems  Constitutional:  Negative for chills and fever.  Respiratory:  Negative for cough and shortness of breath.   Cardiovascular:  Negative for chest pain and palpitations.  Gastrointestinal:  Positive for abdominal pain. Negative for constipation, diarrhea, nausea and vomiting.  Genitourinary:  Positive for dysuria and frequency. Negative for flank pain, hematuria, pelvic pain and vaginal discharge.  Skin:  Negative  for color change and rash.  All other systems reviewed and are negative.    Physical Exam Triage Vital Signs ED Triage Vitals  Enc Vitals Group     BP      Pulse      Resp      Temp      Temp src      SpO2      Weight      Height      Head Circumference      Peak Flow      Pain Score      Pain Loc      Pain Edu?      Excl. in [place]?    No data found.  Updated Vital Signs BP 106/71   Pulse 89   Temp 98.3 F (36.8 C)   Resp 18   LMP 09/26/2021   SpO2 99%   Visual Acuity Right Eye Distance:   Left Eye Distance:   Bilateral Distance:    Right Eye Near:   Left Eye Near:    Bilateral Near:     Physical Exam Vitals and nursing note reviewed.  Constitutional:      General: She is not in acute distress.    Appearance: She is well-developed. She is not ill-appearing.  HENT:     Mouth/Throat:     Mouth: Mucous membranes are moist.  Cardiovascular:     Rate and Rhythm: Normal rate and regular rhythm.     Heart sounds: Normal heart sounds.  Pulmonary:     Effort: Pulmonary effort is normal. No respiratory distress.     Breath sounds: Normal breath sounds.  Abdominal:     General: Bowel sounds are normal.     Palpations: Abdomen is soft.     Tenderness: There is no abdominal tenderness. There is no right CVA tenderness, left CVA tenderness, guarding or rebound.     Comments: Abdomen is soft and nontender with good bowel sounds.  Musculoskeletal:     Cervical back: Neck supple.  Skin:    General: Skin is warm and dry.  Neurological:     Mental Status: She is alert.  Psychiatric:        Mood and Affect: Mood normal.        Behavior: Behavior normal.      UC Treatments / Results  Labs (all labs ordered are listed, but only abnormal results are displayed) Labs Reviewed  POCT URINALYSIS DIP (MANUAL ENTRY) - Abnormal; Notable for the following components:      Result Value   pH, UA 8.5 (*)    Protein Ur, POC =30 (*)    All other components within normal  limits  POCT URINE PREGNANCY    EKG   Radiology No results found.  Procedures Procedures (including critical care time)  Medications Ordered in UC Medications - No data to display  Initial Impression / Assessment and Plan / UC Course  I have reviewed the triage vital signs and the nursing notes.  Pertinent labs & imaging results that were available during my care of the patient were reviewed by me and considered in my medical decision making (see chart for details).    Lower abdominal pain, dysuria, proteinuria.  Patient declines transfer to the ED at this time.  She is not currently having abdominal pain.  Her abdomen is soft and nontender with good bowel sounds.  She is afebrile and vital signs are stable.  Discussed ED precautions for abdominal pain or other concerning symptoms.  Instructed her to follow-up with her PCP for recheck of her urine due to the proteinuria noted today.  Education provided on abdominal pain and dysuria and proteinuria.  Patient agrees to plan of care.  Final Clinical Impressions(s) / UC Diagnoses   Final diagnoses:  Lower abdominal pain  Dysuria  Proteinuria, unspecified type     Discharge Instructions      Your urine does not show signs of infection.  You do have some protein in your urine and this should be rechecked by your primary care provider.  Go to the emergency department if they have abdominal pain or other concerning symptoms.     ED Prescriptions   None    PDMP not reviewed this encounter.   Sharion Balloon, NP 10/12/21 1050

## 2021-10-12 NOTE — Discharge Instructions (Addendum)
Your urine does not show signs of infection.  You do have some protein in your urine and this should be rechecked by your primary care provider.  Go to the emergency department if they have abdominal pain or other concerning symptoms.

## 2021-10-12 NOTE — ED Triage Notes (Signed)
Pt reports Sx's started today with dysuria,frequency.

## 2021-10-14 ENCOUNTER — Ambulatory Visit (INDEPENDENT_AMBULATORY_CARE_PROVIDER_SITE_OTHER): Payer: 59 | Admitting: Urology

## 2021-10-14 ENCOUNTER — Encounter: Payer: Self-pay | Admitting: Urology

## 2021-10-14 VITALS — BP 115/73 | HR 90 | Ht 60.0 in | Wt 145.0 lb

## 2021-10-14 DIAGNOSIS — R109 Unspecified abdominal pain: Secondary | ICD-10-CM

## 2021-10-14 DIAGNOSIS — N3010 Interstitial cystitis (chronic) without hematuria: Secondary | ICD-10-CM | POA: Diagnosis not present

## 2021-10-14 LAB — URINALYSIS, COMPLETE
Bilirubin, UA: NEGATIVE
Glucose, UA: NEGATIVE
Ketones, UA: NEGATIVE
Leukocytes,UA: NEGATIVE
Nitrite, UA: NEGATIVE
Protein,UA: NEGATIVE
RBC, UA: NEGATIVE
Specific Gravity, UA: 1.015 (ref 1.005–1.030)
Urobilinogen, Ur: 0.2 mg/dL (ref 0.2–1.0)
pH, UA: 7.5 (ref 5.0–7.5)

## 2021-10-14 LAB — BLADDER SCAN AMB NON-IMAGING

## 2021-10-14 LAB — MICROSCOPIC EXAMINATION: Bacteria, UA: NONE SEEN

## 2021-10-14 NOTE — Progress Notes (Signed)
10/15/21 8:22 AM   Alexis Snow 11-01-1990 [id number]  Referring provider:  Wayland Denis, PA-C [address] [place],  [place] [address] Chief Complaint  Patient presents with   Cystitis     HPI: Alexis Snow is a 31 y.o.female who presents today for further evaluation of lower abdominal pain, dysuria, and urinary urgency.   She was seen at urgent care in [place] on 10/12/2021. She presents with lower abdominal pain, dysuria and urinary frequency. UA was unremarkable.  She reports that had UTI symptoms after she had been wearing a wet bathing suit before her symptoms started on Sunday.  She ad been drinking seltzers and soda and eating sour candy this past weekend. She has pain when urinating, throbbing in her bladder and in her urethra.  She denies any overt dysuria.. She reports she does not use Azo.  She did have an episode in early February with dysuria, hematuria with clots.  Her urine was frankly positive on this occasion.  Ultimately, the culture did not grow any specific bacteria however her symptoms improved with antibiotics.  She reports that she had several other occasions where she is felt like she has urinary tract symptoms, possibly UTI and then her urinalysis is benign/bland.  In the past, she has been diagnosed with IBS-C due to issues with constipation.  This subsided when she started taking daily probiotic.  Has not been consistently using this in the recent past.  She denies any dyspareunia or pain with intercourse.  She does not feel that her symptoms are brought on by sexual activity.  PVR today 39 ml. UA benign.   PMH: Past Medical History:  Diagnosis Date   Anxiety    Gestational diabetes    Liver disease    OCD (obsessive compulsive disorder)    Postpartum depression    Vitamin D deficiency     Surgical History: Past Surgical History:  Procedure Laterality Date   CESAREAN SECTION N/A 12/09/2016   Procedure: CESAREAN  SECTION;  Surgeon: Brayton Mars, MD;  Location: [facility];  Service: Obstetrics;  Laterality: N/A;   ESOPHAGOGASTRODUODENOSCOPY (EGD) WITH PROPOFOL N/A 11/28/2020   Procedure: ESOPHAGOGASTRODUODENOSCOPY (EGD) WITH PROPOFOL;  Surgeon: [name], MD;  Location: [facility];  Service: Endoscopy;  Laterality: N/A;   TONSILLECTOMY AND ADENOIDECTOMY      Home Medications:  Allergies as of 10/14/2021   No Known Allergies      Medication List        Accurate as of October 14, 2021 11:59 PM. If you have any questions, ask your nurse or doctor.          STOP taking these medications    diazepam 5 MG tablet Commonly known as: Valium Stopped by: Hollice Espy, MD   prenatal multivitamin Tabs tablet Stopped by: Hollice Espy, MD   sertraline 25 MG tablet Commonly known as: Zoloft Stopped by: Hollice Espy, MD   Vitamin D3 125 MCG (5000 UT) Caps Stopped by: Hollice Espy, MD       TAKE these medications    PROBIOTIC PO Take by mouth.        Allergies:  No Known Allergies  Family History: Family History  Problem Relation Age of Onset   Cancer Maternal Grandmother    Heart disease Maternal Grandmother    Diabetes Maternal Grandmother    Liver disease Maternal Grandmother    Cancer Maternal Grandfather    Heart disease Paternal Grandfather    Breast cancer Neg Hx  Ovarian cancer Neg Hx    Colon cancer Neg Hx    Prostate cancer Neg Hx    Kidney cancer Neg Hx    Bladder Cancer Neg Hx     Social History:  reports that she has never smoked. She has never used smokeless tobacco. She reports current alcohol use. She reports that she does not use drugs.   Physical Exam: BP 115/73   Pulse 90   Ht 5' (1.524 m)   Wt 145 lb (65.8 kg)   LMP 09/26/2021   BMI 28.32 kg/m   Constitutional:  Alert and oriented, No acute distress. HEENT: [place] AT, moist mucus membranes.  Trachea midline, no masses. Cardiovascular: No clubbing, cyanosis, or  edema. Respiratory: Normal respiratory effort, no increased work of breathing. Skin: No rashes, bruises or suspicious lesions. Neurologic: Grossly intact, no focal deficits, moving all 4 extremities. Psychiatric: Normal mood and affect.  Laboratory Data:  Lab Results  Component Value Date   CREATININE 0.55 12/08/2016   Pertinent imaging :  Results for orders placed or performed in visit on 10/14/21  Microscopic Examination   Urine  Result Value Ref Range   WBC, UA 0-5 0 - 5 /hpf   RBC 0-2 0 - 2 /hpf   Epithelial Cells (non renal) 0-10 0 - 10 /hpf   Bacteria, UA None seen None seen/Few  Urinalysis, Complete  Result Value Ref Range   Specific Gravity, UA 1.015 1.005 - 1.030   pH, UA 7.5 5.0 - 7.5   Color, UA Yellow Yellow   Appearance Ur Clear Clear   Leukocytes,UA Negative Negative   Protein,UA Negative Negative/Trace   Glucose, UA Negative Negative   Ketones, UA Negative Negative   RBC, UA Negative Negative   Bilirubin, UA Negative Negative   Urobilinogen, Ur 0.2 0.2 - 1.0 mg/dL   Nitrite, UA Negative Negative   Microscopic Examination See below:   BLADDER SCAN AMB NON-IMAGING  Result Value Ref Range   Scan Result 18m     Assessment & Plan:     Interstitial cystitis  - UA benign -Based on her clinical history as well as precipitating risk factors, suspect she has some underlying interstitial cystitis rather than true urinary tract infections. - She is emptying adequately with PVR of 39 ml - We discussed the diease pathophysiology is poorly understood therefore treatment has been focused primarily on symptomatic relief as well as dietary and behavioral modification. Information pamphlets were reviewed and given today discussing the current understanding of the syndrome as well as treatment options. IC dietary information also given today as many patients experience relief with simple lifestyle modifications. We also discussed intermittent use of over the counter  pyridium (no greater than 3 days at at time) for intermittent relief.  If conservative management fails, will consider further work up with cystoscopy to access for Hunter's ulcers or other more aggressive treatments, however, response to each of these interventions is highly variable.  - low threshold to return if she has gross hematuria or if her symptoms do not improve    Follow-up as needed  IConley Rollsas a scribe for AHollice Espy MD.,have documented all relevant documentation on the behalf of AHollice Espy MD,as directed by  AHollice Espy MD while in the presence of AHollice Espy MD.  I have reviewed the above documentation for accuracy and completeness, and I agree with the above.   AHollice Espy MD  [facility] [address] [place]  [phone]  [phone]  I spent 47 total minutes on the day of the encounter including pre-visit review of the medical record, face-to-face time with the patient, and post visit ordering of labs/imaging/tests.

## 2021-12-15 ENCOUNTER — Ambulatory Visit (INDEPENDENT_AMBULATORY_CARE_PROVIDER_SITE_OTHER): Payer: 59 | Admitting: Certified Nurse Midwife

## 2021-12-15 ENCOUNTER — Encounter: Payer: Self-pay | Admitting: Certified Nurse Midwife

## 2021-12-15 VITALS — BP 112/68 | HR 80 | Ht 60.0 in | Wt 145.1 lb

## 2021-12-15 DIAGNOSIS — Z87448 Personal history of other diseases of urinary system: Secondary | ICD-10-CM | POA: Diagnosis not present

## 2021-12-15 DIAGNOSIS — R102 Pelvic and perineal pain: Secondary | ICD-10-CM | POA: Diagnosis not present

## 2021-12-15 DIAGNOSIS — R109 Unspecified abdominal pain: Secondary | ICD-10-CM | POA: Diagnosis not present

## 2021-12-15 NOTE — Progress Notes (Signed)
GYN ENCOUNTER NOTE  Subjective:       Alexis Snow is a 31 y.o. G71P1011 female is here for gynecologic evaluation of the following issues:  1. Right Flank pain blood in her urine 2. Pelvic pain.   Pt state she has had blood in her urine and has had a few occasions with flank pain. Her pcp suggested that she may have kidney stone but never did imaging. She is requesting imaging.     Pelvic pain x 6 months, occurs prior to and during her cycle , and with sexual arousal. States she has bloating. Has has significant family history of fibroids , fatty liver , and cancer.    Gynecologic History Patient's last menstrual period was 12/12/2021. Contraception:  NFP Last Pap: 08/30/2019. Results were: normal Last mammogram: n/a .   Obstetric History OB History  Gravida Para Term Preterm AB Living  '2 1 1   1 1  '$ SAB IAB Ectopic Multiple Live Births    1     1    # Outcome Date GA Lbr Len/2nd Weight Sex Delivery Anes PTL Lv  2 Term 12/09/16 [redacted]w[redacted]d 8 lb (3.629 kg) M CS-Unspec  N LIV     Complications: Shoulder Dystocia  1 IAB 2013            Past Medical History:  Diagnosis Date   Anxiety    Gestational diabetes    Liver disease    OCD (obsessive compulsive disorder)    Postpartum depression    Vitamin D deficiency     Past Surgical History:  Procedure Laterality Date   CESAREAN SECTION N/A 12/09/2016   Procedure: CESAREAN SECTION;  Surgeon: DBrayton Mars MD;  Location: [facility];  Service: Obstetrics;  Laterality: N/A;   ESOPHAGOGASTRODUODENOSCOPY (EGD) WITH PROPOFOL N/A 11/28/2020   Procedure: ESOPHAGOGASTRODUODENOSCOPY (EGD) WITH PROPOFOL;  Surgeon: LLesly Rubenstein MD;  Location: [facility];  Service: Endoscopy;  Laterality: N/A;   TONSILLECTOMY AND ADENOIDECTOMY      Current Outpatient Medications on File Prior to Visit  Medication Sig Dispense Refill   Probiotic Product (PROBIOTIC PO) Take by mouth.     No current facility-administered medications on file prior  to visit.    No Known Allergies  Social History   Socioeconomic History   Marital status: Married    Spouse name: Not on file   Number of children: Not on file   Years of education: Not on file   Highest education level: Not on file  Occupational History   Not on file  Tobacco Use   Smoking status: Never   Smokeless tobacco: Never  Vaping Use   Vaping Use: Never used  Substance and Sexual Activity   Alcohol use: Yes    Comment: occass   Drug use: No   Sexual activity: Yes    Birth control/protection: None  Other Topics Concern   Not on file  Social History Narrative   Not on file   Social Determinants of Health   Financial Resource Strain: Not on file  Food Insecurity: Not on file  Transportation Needs: Not on file  Physical Activity: Not on file  Stress: Not on file  Social Connections: Not on file  Intimate Partner Violence: Not on file    Family History  Problem Relation Age of Onset   Cancer Maternal Grandmother    Heart disease Maternal Grandmother    Diabetes Maternal Grandmother    Liver disease Maternal Grandmother    Cancer Maternal  Grandfather    Heart disease Paternal Grandfather    Breast cancer Neg Hx    Ovarian cancer Neg Hx    Colon cancer Neg Hx    Prostate cancer Neg Hx    Kidney cancer Neg Hx    Bladder Cancer Neg Hx     The following portions of the patient's history were reviewed and updated as appropriate: allergies, current medications, past family history, past medical history, past social history, past surgical history and problem list.  Review of Systems Review of Systems - Negative except as mentioned in HPI Review of Systems - General ROS: negative for - chills, fatigue, fever, hot flashes, malaise or night sweats Hematological and Lymphatic ROS: negative for - bleeding problems or swollen lymph nodes Gastrointestinal ROS: negative for - abdominal pain, blood in stools, change in bowel habits and  nausea/vomiting Musculoskeletal ROS: negative for - joint pain, muscle pain or muscular weakness. Genito-Urinary ROS: negative for - change in menstrual cycle, dysmenorrhea, dyspareunia, dysuria, genital discharge, genital ulcers,. Right flank pain and blood in urine /incontinence, irregular/heavy menses, nocturia. Positive for pelvic pain  .  Objective:   BP 112/68   Pulse 80   Ht 5' (1.524 m)   Wt 145 lb 1.6 oz (65.8 kg)   LMP 12/12/2021   BMI 28.34 kg/m  CONSTITUTIONAL: Well-developed, well-nourished female in no acute distress.  HENT:  Normocephalic, atraumatic.  NECK: Normal range of motion, supple, no masses.  Normal thyroid.  SKIN: Skin is warm and dry. No rash noted. Not diaphoretic. No erythema. No pallor. [place]: Alert and oriented to person, place, and time. PSYCHIATRIC: Normal mood and affect. Normal behavior. Normal judgment and thought content. CARDIOVASCULAR:Not Examined RESPIRATORY: Not Examined BREASTS: Not Examined ABDOMEN: Soft, non distended; Non tender.  No Organomegaly. PELVIC:pt declines , state she is on her period today MUSCULOSKELETAL: Normal range of motion. No tenderness.  No cyanosis, clubbing, or edema.     Assessment:   Pelvic pain  Flank pain    Plan:   Discussed possible causes of flank pain including musculoskeletal vs kidney stone. Discussed pelvic pain and common causes including firbroid, ovarian cysts, pelvic congestion syndrome , and scar tissue pain. Orders placed for reneal u/s and pelvic u/s. Will follow up with results. Discussed pelvic floor PT as an option for treatment as well. She is open to this option.   Philip Aspen, CNM

## 2021-12-23 ENCOUNTER — Other Ambulatory Visit: Payer: 59

## 2021-12-30 ENCOUNTER — Ambulatory Visit
Admission: RE | Admit: 2021-12-30 | Discharge: 2021-12-30 | Disposition: A | Discharge status: Home / Self Care | Payer: 59 | Source: Ambulatory Visit | Attending: Certified Nurse Midwife | Admitting: Certified Nurse Midwife

## 2021-12-30 ENCOUNTER — Encounter: Payer: Self-pay | Admitting: Certified Nurse Midwife

## 2021-12-30 DIAGNOSIS — Z87448 Personal history of other diseases of urinary system: Secondary | ICD-10-CM | POA: Insufficient documentation

## 2021-12-30 DIAGNOSIS — R109 Unspecified abdominal pain: Secondary | ICD-10-CM | POA: Diagnosis not present

## 2022-01-05 ENCOUNTER — Other Ambulatory Visit: Payer: 59

## 2022-01-06 ENCOUNTER — Ambulatory Visit (INDEPENDENT_AMBULATORY_CARE_PROVIDER_SITE_OTHER): Payer: 59

## 2022-01-06 DIAGNOSIS — R102 Pelvic and perineal pain: Secondary | ICD-10-CM

## 2022-01-13 ENCOUNTER — Ambulatory Visit (INDEPENDENT_AMBULATORY_CARE_PROVIDER_SITE_OTHER): Payer: 59 | Admitting: Allergy

## 2022-01-13 ENCOUNTER — Other Ambulatory Visit: Payer: Self-pay | Admitting: Certified Nurse Midwife

## 2022-01-13 ENCOUNTER — Encounter: Payer: Self-pay | Admitting: Allergy

## 2022-01-13 ENCOUNTER — Encounter: Payer: Self-pay | Admitting: Certified Nurse Midwife

## 2022-01-13 VITALS — BP 122/70 | HR 110 | Temp 98.1°F | Ht 59.5 in | Wt 145.0 lb

## 2022-01-13 DIAGNOSIS — J3089 Other allergic rhinitis: Secondary | ICD-10-CM

## 2022-01-13 DIAGNOSIS — T781XXD Other adverse food reactions, not elsewhere classified, subsequent encounter: Secondary | ICD-10-CM | POA: Insufficient documentation

## 2022-01-13 DIAGNOSIS — T781XXA Other adverse food reactions, not elsewhere classified, initial encounter: Secondary | ICD-10-CM

## 2022-01-13 DIAGNOSIS — R319 Hematuria, unspecified: Secondary | ICD-10-CM

## 2022-01-13 DIAGNOSIS — R102 Pelvic and perineal pain: Secondary | ICD-10-CM

## 2022-01-13 DIAGNOSIS — R0989 Other specified symptoms and signs involving the circulatory and respiratory systems: Secondary | ICD-10-CM | POA: Insufficient documentation

## 2022-01-13 MED ORDER — RYALTRIS 665-25 MCG/ACT NA SUSP: 1.0000 | Freq: Two times a day (BID) | NASAL | 5 refills | Status: AC

## 2022-01-13 NOTE — Patient Instructions (Signed)
Today's skin testing showed: Positive to grass, weed, trees, mold.  Negative to oysters, scallops.   Results given.  Food  Food allergen skin testing has excellent negative predictive value however there is still a small chance that the allergy exists. Therefore, we will investigate further with serum specific IgE levels. Start strict avoidance of mollusks For mild symptoms you can take over the counter antihistamines such as Benadryl and monitor symptoms closely. If symptoms worsen or if you have severe symptoms including breathing issues, throat closure, significant swelling, whole body hives, severe diarrhea and vomiting, lightheadedness then seek immediate medical care. Get bloodwork We are ordering labs, so please allow 1-2 weeks for the results to come back. With the newly implemented Cures Act, the labs might be visible to you at the same time that they become visible to me. However, I will not address the results until all of the results are back, so please be patient.  In the meantime, continue recommendations in your patient instructions, including avoidance measures (if applicable), until you hear from me.  Environmental allergies Start environmental control measures as below. Start taking a daily allergy medication. Use over the counter antihistamines such as Zyrtec (cetirizine), Claritin (loratadine), Allegra (fexofenadine), or Xyzal (levocetirizine) daily as needed. May take twice a day during allergy flares. May switch antihistamines every few months. Start Ryaltris (olopatadine + mometasone nasal spray combination) 1-2 sprays per nostril twice a day. Sample given. This replaces your other nasal sprays. If this works well for you, then have [facility] ship the medication to your home - prescription already sent in.   Throat sensation Not sure if this is due to environmental allergies or not. If the above nasal spray does not help then start an over the counter reflux medication  such as omeprazole '20mg'$  once a day. If no improvement will refer to ENT/GI next.  Follow up in 2 months or sooner if needed.    Reducing Pollen Exposure Pollen seasons: trees (spring), grass (summer) and ragweed/weeds (fall). Keep windows closed in your home and car to lower pollen exposure.  Install air conditioning in the bedroom and throughout the house if possible.  Avoid going out in dry windy days - especially early morning. Pollen counts are highest between 5 - 10 AM and on dry, hot and windy days.  Save outside activities for late afternoon or after a heavy rain, when pollen levels are lower.  Avoid mowing of grass if you have grass pollen allergy. Be aware that pollen can also be transported indoors on people and pets.  Dry your clothes in an automatic dryer rather than hanging them outside where they might collect pollen.  Rinse hair and eyes before bedtime.  Mold Control Mold and fungi can grow on a variety of surfaces provided certain temperature and moisture conditions exist.  Outdoor molds grow on plants, decaying vegetation and soil. The major outdoor mold, Alternaria and Cladosporium, are found in very high numbers during hot and dry conditions. Generally, a late summer - fall peak is seen for common outdoor fungal spores. Rain will temporarily lower outdoor mold spore count, but counts rise rapidly when the rainy period ends. The most important indoor molds are Aspergillus and Penicillium. Dark, humid and poorly ventilated basements are ideal sites for mold growth. The next most common sites of mold growth are the bathroom and the kitchen. Outdoor (Seasonal) Mold Control Use air conditioning and keep windows closed. Avoid exposure to decaying vegetation. Avoid leaf raking. Avoid grain handling. Consider  wearing a face mask if working in moldy areas.  Indoor (Perennial) Mold Control  Maintain humidity below 50%. Get rid of mold growth on hard surfaces with water,  detergent and, if necessary, 5% bleach (do not mix with other cleaners). Then dry the area completely. If mold covers an area more than 10 square feet, consider hiring an indoor environmental professional. For clothing, washing with soap and water is best. If moldy items cannot be cleaned and dried, throw them away. Remove sources e.g. contaminated carpets. Repair and seal leaking roofs or pipes. Using dehumidifiers in damp basements may be helpful, but empty the water and clean units regularly to prevent mildew from forming. All rooms, especially basements, bathrooms and kitchens, require ventilation and cleaning to deter mold and mildew growth. Avoid carpeting on concrete or damp floors, and storing items in damp areas.

## 2022-01-13 NOTE — Assessment & Plan Note (Signed)
Perioral pruritus, throat clearing and nasal congestion after being around steamed oysters. Improved after benadryl but then had dysphagia after trying to eat a cheeseburger. Tolerates shellfish and finned fish with no issues. Does not eat mollusks.  Today's skin testing showed: Negative to oysters, scallops.   Food allergen skin testing has excellent negative predictive value however there is still a small chance that the allergy exists. Therefore, we will investigate further with serum specific IgE levels.  Start strict avoidance of mollusks.  For mild symptoms you can take over the counter antihistamines such as Benadryl and monitor symptoms closely. If symptoms worsen or if you have severe symptoms including breathing issues, throat closure, significant swelling, whole body hives, severe diarrhea and vomiting, lightheadedness then seek immediate medical care.  Get bloodwork.

## 2022-01-13 NOTE — Assessment & Plan Note (Signed)
Noted some throat discomfort and dysphagia at times. Sometimes has reflux.  . Not sure if this is due to environmental allergies or not. . If the above nasal spray does not help then start an over the counter reflux medication such as omeprazole '20mg'$  once a day. . If no improvement will refer to ENT/GI next.

## 2022-01-13 NOTE — Progress Notes (Signed)
New Patient Note  RE: Alexis Snow MRN: [id number] DOB: Nov 02, 1990 Date of Office Visit: 01/13/2022  Consult requested by: Wayland Denis, PA-C Primary care provider: Wayland Denis, PA-C  Chief Complaint: Allergic Reaction (Throat itching, clearing, not closing but a little tight)  History of Present Illness: I had the pleasure of seeing Alexis Snow for initial evaluation at the [facility] on 01/13/2022. She is a 31 y.o. female, who is self-referred here for the evaluation of food allergy.  1-2 weeks ago patient states that she was at a restaurant where mom was eating steamed oysters. She noted some perioral pruritus, throat clearing, nasal congestion. Patient took benadryl at the restaurant which helped the perioral pruritus.   She had a cheeseburger 30 minute afterwards and she had difficulty swallowing but she also has anxiety.  Yesterday she was at the produce section and had some perioral pruritus. She was drinking sprite and had difficulty swallowing that.   Past work up includes: none. Dietary History: patient has been eating other foods including milk, eggs, peanut, treenuts, sesame, shellfish, fish, soy, wheat, meats, fruits and vegetables.  Patient usually does not eat mollusks.  Denies any changes in diet, meds, personal care products. Some issues with reflux after eating spicy foods.   Assessment and Plan: Alexis Snow is a 31 y.o. female with: Other adverse food reactions, not elsewhere classified, subsequent encounter Perioral pruritus, throat clearing and nasal congestion after being around steamed oysters. Improved after benadryl but then had dysphagia after trying to eat a cheeseburger. Tolerates shellfish and finned fish with no issues. Does not eat mollusks. Today's skin testing showed: Negative to oysters, scallops.  Food allergen skin testing has excellent negative predictive value however there is still a small chance that the allergy exists.  Therefore, we will investigate further with serum specific IgE levels. Start strict avoidance of mollusks. For mild symptoms you can take over the counter antihistamines such as Benadryl and monitor symptoms closely. If symptoms worsen or if you have severe symptoms including breathing issues, throat closure, significant swelling, whole body hives, severe diarrhea and vomiting, lightheadedness then seek immediate medical care. Get bloodwork.  Other allergic rhinitis Some symptoms in the spring and around dust. Lately noticed perioral pruritus.  Today's skin testing showed: Positive to grass, weed, trees, mold.  Start environmental control measures as below. Start taking a daily allergy medication. Use over the counter antihistamines such as Zyrtec (cetirizine), Claritin (loratadine), Allegra (fexofenadine), or Xyzal (levocetirizine) daily as needed. May take twice a day during allergy flares. May switch antihistamines every few months. Start Ryaltris (olopatadine + mometasone nasal spray combination) 1-2 sprays per nostril twice a day. Sample given. This replaces your other nasal sprays. If this works well for you, then have [facility] ship the medication to your home - prescription already sent in.   Globus sensation Noted some throat discomfort and dysphagia at times. Sometimes has reflux.  Not sure if this is due to environmental allergies or not. If the above nasal spray does not help then start an over the counter reflux medication such as omeprazole '20mg'$  once a day. If no improvement will refer to ENT/GI next.  Return in about 2 months (around 03/15/2022).  Meds ordered this encounter  Medications   Olopatadine-Mometasone (RYALTRIS) 665-25 MCG/ACT SUSP    Sig: Place 1-2 sprays into the nose in the morning and at bedtime.    Dispense:  29 g    Refill:  5    [phone]  Lab Orders         Allergen Profile, Shellfish      Other allergy screening: Asthma: no Rhino  conjunctivitis: yes Some symptoms in the spring and around dust. Medication allergy: no Hymenoptera allergy: no Urticaria: no Eczema:no History of recurrent infections suggestive of immunodeficency: no  Diagnostics: Skin Testing: Environmental allergy panel and select foods. Positive to grass, weed, trees, mold.  Negative to oysters, scallops.  Results discussed with patient/family.  Airborne Adult Perc - 01/13/22 1513     Time Antigen Placed 0245    Allergen Manufacturer Lavella Hammock    Location Back    Number of Test 59    Panel 1 Select    1. Control-Buffer 50% Glycerol Negative    2. Control-Histamine 1 mg/ml 2+    3. Albumin saline Negative    4. [place]ont Negative    5. [place] Negative    6. Johnson Negative    7. Kentucky Blue 2+    8. Meadow Fescue 2+    9. Perennial Rye 2+    10. Sweet Vernal 2+    11. Timothy Negative    12. Cocklebur 3+    13. Burweed Marshelder 2+    14. Ragweed, short Negative    15. Ragweed, Giant Negative    16. Plantain,  English Negative    17. Lamb's Quarters Negative    18. Sheep Sorrell Negative    19. Rough Pigweed Negative    20. Marsh Elder, Rough Negative    21. Mugwort, Common Negative    22. Ash mix Negative    23. Birch mix Negative    24. Beech American Negative    25. Box, Elder Negative    26. Cedar, red Negative    27. Cottonwood, [place] Negative    28. Elm mix Negative    29. Hickory Negative    30. Maple mix Negative    31. Oak, [place] mix Negative    32. Pecan Pollen Negative    33. Pine mix Negative    34. Sycamore Eastern Negative    35. Darien, Black Pollen Negative    36. Alternaria alternata Negative    37. Cladosporium Herbarum Negative    38. Aspergillus mix Negative    39. Penicillium mix Negative    40. Bipolaris sorokiniana (Helminthosporium) 2+    41. Drechslera spicifera (Curvularia) Negative    42. Mucor plumbeus 2+    43. Fusarium moniliforme Negative    44. Aureobasidium pullulans (pullulara) 2+     45. Rhizopus oryzae Negative    46. Botrytis cinera Negative    47. Epicoccum nigrum Negative    48. Phoma betae Negative    49. Candida Albicans Negative    50. Trichophyton mentagrophytes Negative    51. Mite, D Farinae  5,000 AU/ml Negative    52. Mite, D Pteronyssinus  5,000 AU/ml Negative    53. Cat Hair 10,000 BAU/ml Negative    54.  Dog Epithelia Negative    55. Mixed Feathers Negative    56. Horse Epithelia Negative    57. Cockroach, German Negative    58. Mouse Negative    59. Tobacco Leaf Negative             Intradermal - 01/13/22 1512     Time Antigen Placed 7482    Allergen Manufacturer Lavella Hammock    Location Arm    Number of Test 11    Intradermal Select    Control Negative    Guatemala  Negative    Johnson Negative    Ragweed mix Negative    Tree mix 2+    Mold 1 2+    Mold 2 3+    Cat Negative    Dog Negative    Cockroach Negative    Mite mix Negative             Food Adult Perc - 01/13/22 1500     Time Antigen Placed 0245    Allergen Manufacturer Lavella Hammock    Location Back    Number of allergen test 2    28. Oyster Negative    29. Scallops Negative             Past Medical History: Patient Active Problem List   Diagnosis Date Noted   Other adverse food reactions, not elsewhere classified, subsequent encounter 01/13/2022   Globus sensation 01/13/2022   Other allergic rhinitis 01/13/2022   Anxiety 01/29/2021   Post-traumatic stress 01/29/2021   OCD (obsessive compulsive disorder) 01/29/2021   History of dyspareunia in female 02/04/2017   Status post C-section 12/09/2016   Past Medical History:  Diagnosis Date   Anxiety    Gestational diabetes    Liver disease    OCD (obsessive compulsive disorder)    Postpartum depression    Urticaria    Vitamin D deficiency    Past Surgical History: Past Surgical History:  Procedure Laterality Date   ADENOIDECTOMY     CESAREAN SECTION N/A 12/09/2016   Procedure: CESAREAN SECTION;  Surgeon:  Brayton Mars, MD;  Location: [facility] ORS;  Service: Obstetrics;  Laterality: N/A;   ESOPHAGOGASTRODUODENOSCOPY (EGD) WITH PROPOFOL N/A 11/28/2020   Procedure: ESOPHAGOGASTRODUODENOSCOPY (EGD) WITH PROPOFOL;  Surgeon: [name], MD;  Location: [facility];  Service: Endoscopy;  Laterality: N/A;   TONSILLECTOMY     TONSILLECTOMY AND ADENOIDECTOMY     Medication List:  Current Outpatient Medications  Medication Sig Dispense Refill   magnesium 30 MG tablet Take 30 mg by mouth 2 (two) times daily.     Olopatadine-Mometasone (RYALTRIS) [id number] MCG/ACT SUSP Place 1-2 sprays into the nose in the morning and at bedtime. 29 g 5   Probiotic Product (PROBIOTIC PO) Take by mouth.     No current facility-administered medications for this visit.   Allergies: No Known Allergies Social History: Social History   Socioeconomic History   Marital status: Married    Spouse name: Not on file   Number of children: Not on file   Years of education: Not on file   Highest education level: Not on file  Occupational History   Not on file  Tobacco Use   Smoking status: Never   Smokeless tobacco: Never  Vaping Use   Vaping Use: Never used  Substance and Sexual Activity   Alcohol use: Yes    Comment: occass   Drug use: No   Sexual activity: Yes    Birth control/protection: None  Other Topics Concern   Not on file  Social History Narrative   Not on file   Social Determinants of Health   Financial Resource Strain: Not on file  Food Insecurity: Not on file  Transportation Needs: Not on file  Physical Activity: Not on file  Stress: Not on file  Social Connections: Not on file   Lives in a 32 year old house. 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 Carpet in the family room: no Carpet in the bedroom: yes Heating: gas Cooling: central Pet:  yes 3 dogs    Family History: Family History  Problem Relation Age of  Onset   Allergic rhinitis Mother    Allergic rhinitis Father    Cancer Maternal Grandmother    Heart disease Maternal Grandmother    Diabetes Maternal Grandmother    Liver disease Maternal Grandmother    Cancer Maternal Grandfather    Heart disease Paternal Grandfather    Breast cancer Neg Hx    Ovarian cancer Neg Hx    Colon cancer Neg Hx    Prostate cancer Neg Hx    Kidney cancer Neg Hx    Bladder Cancer Neg Hx    Asthma Neg Hx    Eczema Neg Hx    Urticaria Neg Hx    Review of Systems  Constitutional:  Negative for appetite change, chills, fever and unexpected weight change.  HENT:  Negative for congestion and rhinorrhea.        Perioral pruritus  Eyes:  Negative for itching.  Respiratory:  Negative for cough, chest tightness, shortness of breath and wheezing.   Cardiovascular:  Negative for chest pain.  Gastrointestinal:  Negative for abdominal pain.  Genitourinary:  Negative for difficulty urinating.  Skin:  Negative for rash.  Allergic/Immunologic: Positive for environmental allergies.  Neurological:  Negative for headaches.    Objective: BP 122/70 (BP Location: Left Arm, Patient Position: Sitting, Cuff Size: Normal)   Pulse (!) 110   Temp 98.1 F (36.7 C) (Temporal)   Ht 4' 11.5" (1.511 m)   Wt 145 lb (65.8 kg)   LMP 12/12/2021   SpO2 97%   BMI 28.80 kg/m  Body mass index is 28.8 kg/m. Physical Exam Vitals and nursing note reviewed.  Constitutional:      Appearance: Normal appearance. She is well-developed.  HENT:     Head: Normocephalic and atraumatic.     Right Ear: Tympanic membrane and external ear normal.     Left Ear: Tympanic membrane and external ear normal.     Nose: Nose normal.     Mouth/Throat:     Mouth: Mucous membranes are moist.     Pharynx: Oropharynx is clear.  Eyes:     Conjunctiva/sclera: Conjunctivae normal.  Cardiovascular:     Rate and Rhythm: Normal rate and regular rhythm.     Heart sounds: Normal heart sounds. No murmur  heard.    No friction rub. No gallop.  Pulmonary:     Effort: Pulmonary effort is normal.     Breath sounds: Normal breath sounds. No wheezing, rhonchi or rales.  Musculoskeletal:     Cervical back: Neck supple.  Skin:    General: Skin is warm.     Findings: No rash.  Neurological:     Mental Status: She is alert and oriented to person, place, and time.  Psychiatric:        Behavior: Behavior normal.   The plan was reviewed with the patient/family, and all questions/concerned were addressed.  It was my pleasure to see Alexis Snow today and participate in her care. Please feel free to contact me with any questions or concerns.  Sincerely,  Rexene Alberts, DO Allergy & Immunology  [facility] office: [place] office: [phone]

## 2022-01-13 NOTE — Assessment & Plan Note (Signed)
Some symptoms in the spring and around dust. Lately noticed perioral pruritus.   Today's skin testing showed: Positive to grass, weed, trees, mold.   Start environmental control measures as below.  Start taking a daily allergy medication.  Use over the counter antihistamines such as Zyrtec (cetirizine), Claritin (loratadine), Allegra (fexofenadine), or Xyzal (levocetirizine) daily as needed. May take twice a day during allergy flares. May switch antihistamines every few months.  Start Ryaltris (olopatadine + mometasone nasal spray combination) 1-2 sprays per nostril twice a day. Sample given.  This replaces your other nasal sprays.  If this works well for you, then have [facility] ship the medication to your home - prescription already sent in.

## 2022-01-26 ENCOUNTER — Other Ambulatory Visit: Payer: Self-pay | Admitting: Certified Nurse Midwife

## 2022-01-26 DIAGNOSIS — K5909 Other constipation: Secondary | ICD-10-CM

## 2022-01-26 DIAGNOSIS — R140 Abdominal distension (gaseous): Secondary | ICD-10-CM

## 2022-03-23 IMAGING — US US ABDOMEN LIMITED RUQ/ASCITES
1 series · 15 of 25 positions shown · non-contrast
Comparison: Ultrasound July 18, 2014

CLINICAL DATA: Right upper quadrant pain

EXAM:
ULTRASOUND ABDOMEN LIMITED RIGHT UPPER QUADRANT

[Series 1: us abdomen limited mc & wl · 15 of 43 slices shown]
[im 1/43]
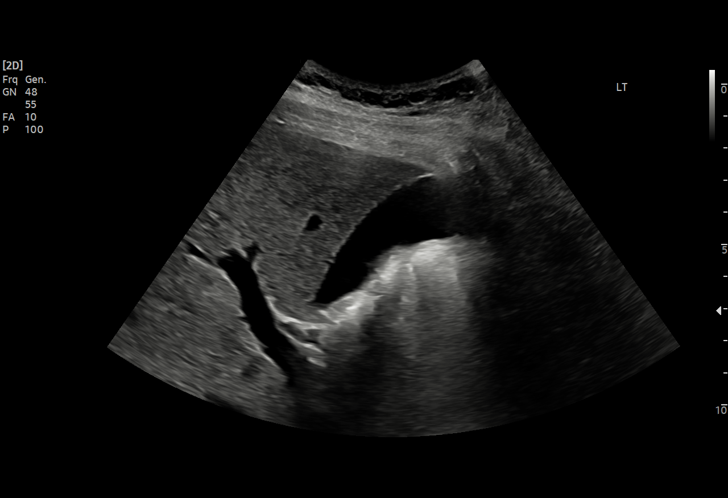
[im 4/43]
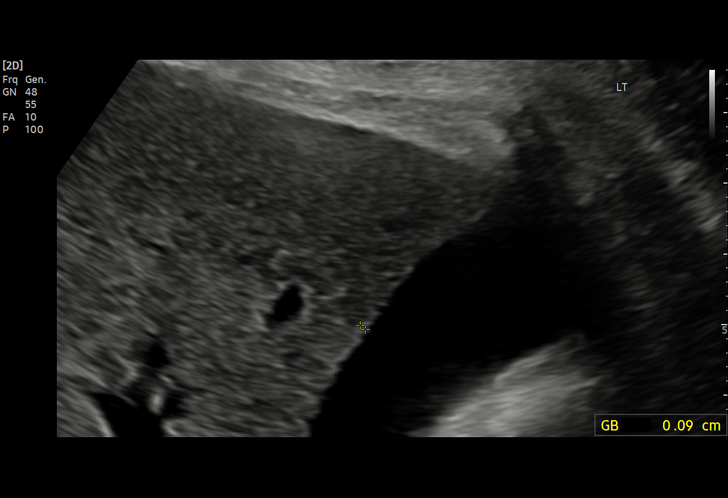
[im 8/43]
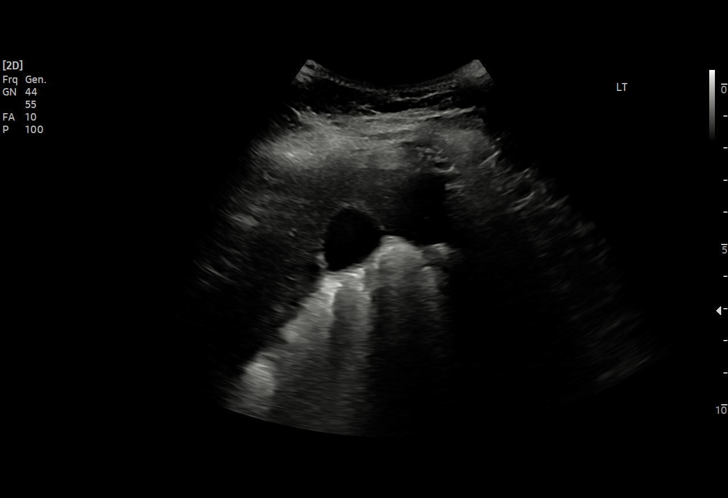
[im 9/43]
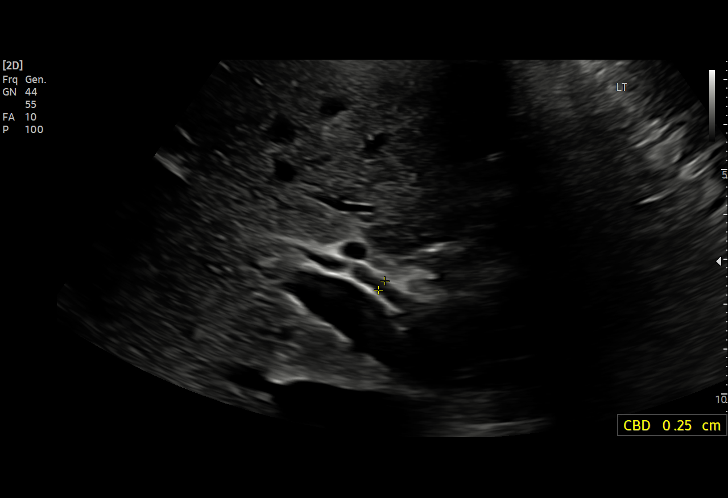
[im 13/43]
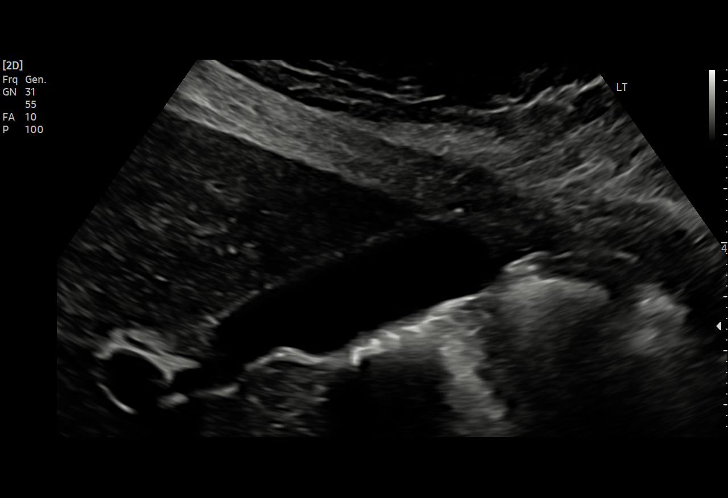
[im 16/43]
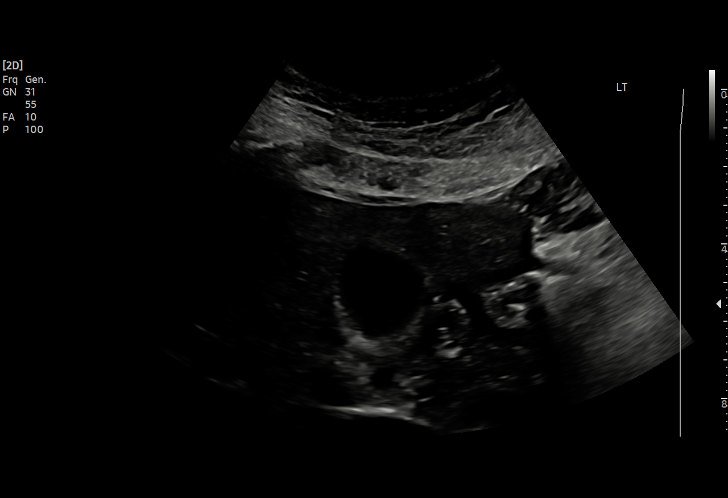
[im 18/43]
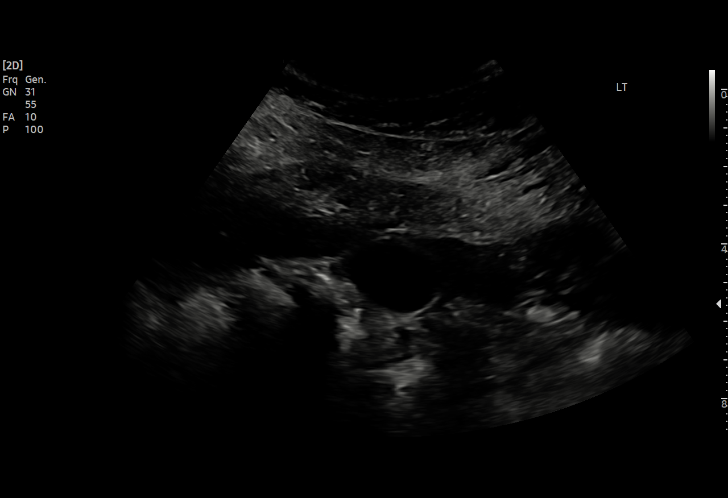
[im 22/43]
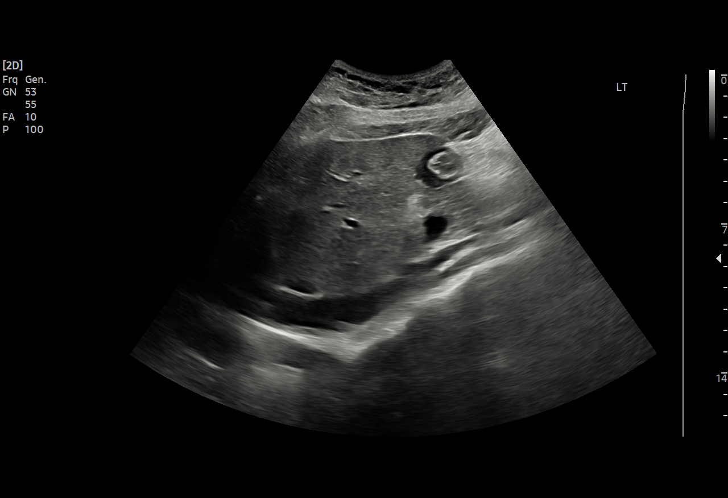
[im 25/43]
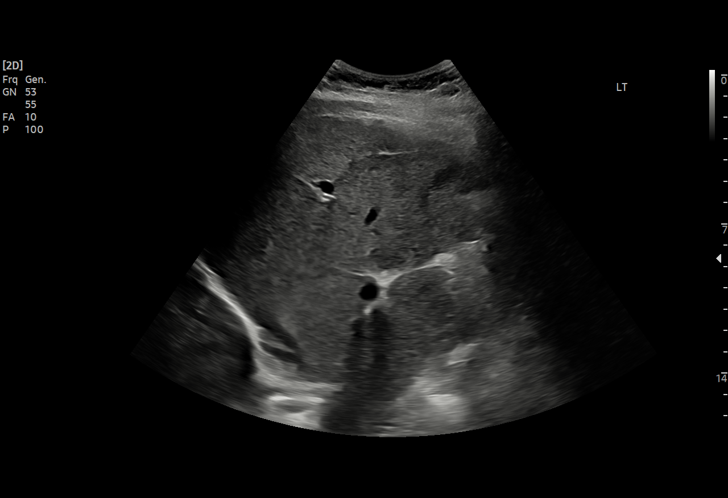
[im 27/43]
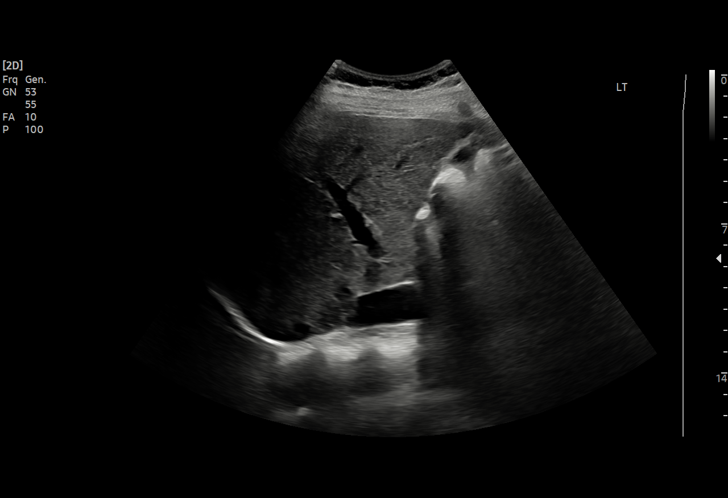
[im 30/43]
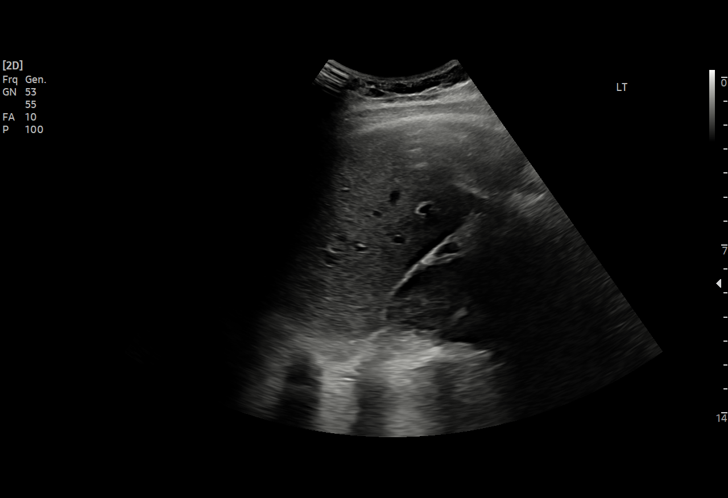
[im 34/43]
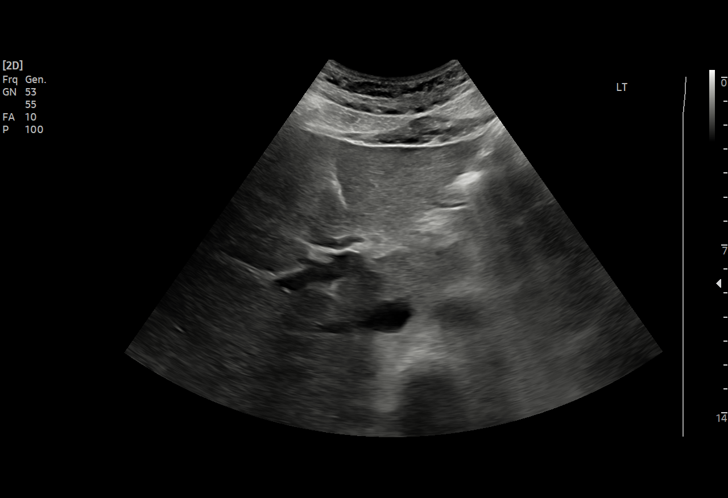
[im 36/43]
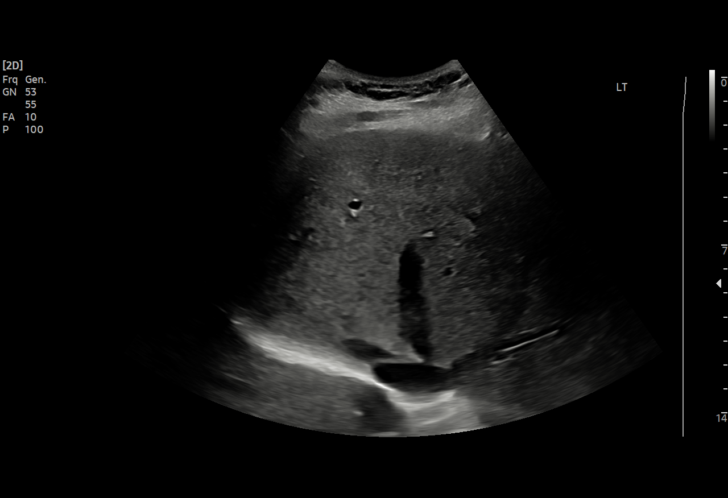
[im 39/43]
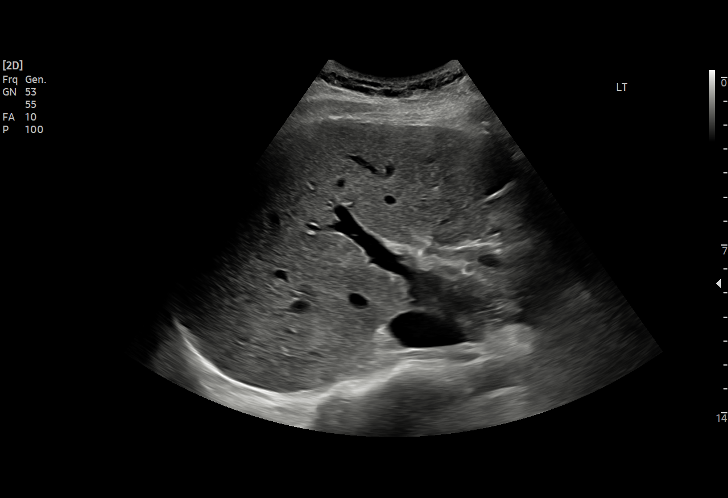
[im 43/43]
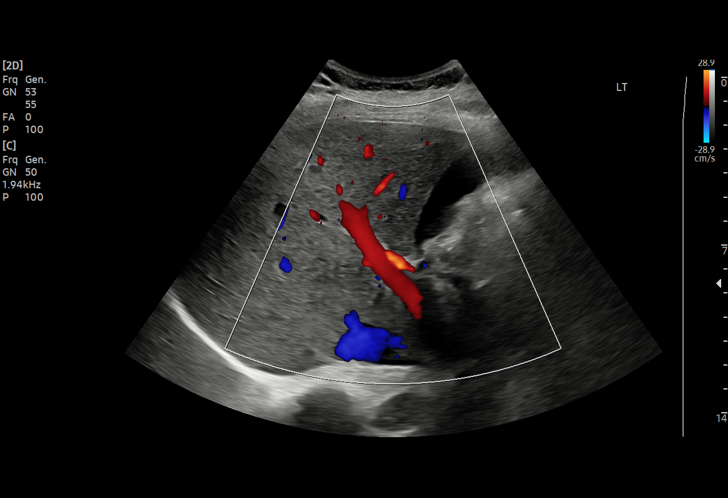

[15 of 25 positions shown; findings below may reference images not displayed]

FINDINGS: Gallbladder:

No gallstones or wall thickening visualized. No sonographic Murphy
sign noted by sonographer.

Common bile duct:

Diameter: 3 mm

Liver:

No focal lesion identified. Diffusely increased parenchymal
echogenicity. Portal vein is patent on color Doppler imaging with
normal direction of blood flow towards the liver.

Other: None.
IMPRESSION: The echogenicity of the liver is increased. This is a nonspecific
finding but is most commonly seen with fatty infiltration of the
liver. There are no obvious focal liver lesions.

## 2022-04-15 ENCOUNTER — Ambulatory Visit: Payer: 59 | Admitting: Allergy

## 2022-07-15 ENCOUNTER — Ambulatory Visit (INDEPENDENT_AMBULATORY_CARE_PROVIDER_SITE_OTHER): Payer: 59 | Admitting: Certified Nurse Midwife

## 2022-07-15 ENCOUNTER — Encounter: Payer: Self-pay | Admitting: Certified Nurse Midwife

## 2022-07-15 VITALS — BP 109/74 | HR 76 | Wt 144.4 lb

## 2022-07-15 DIAGNOSIS — N764 Abscess of vulva: Secondary | ICD-10-CM | POA: Diagnosis not present

## 2022-07-15 MED ORDER — LORAZEPAM 1 MG PO TABS: 1.0000 mg | ORAL_TABLET | Freq: Three times a day (TID) | ORAL | 0 refills | Status: AC
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
## 2022-07-29 ENCOUNTER — Encounter: Payer: Self-pay | Admitting: Certified Nurse Midwife

## 2022-07-29 NOTE — Telephone Encounter (Signed)
Can you add to her Friday schedule?

## 2022-07-31 ENCOUNTER — Encounter: Payer: Self-pay | Admitting: Certified Nurse Midwife

## 2022-07-31 ENCOUNTER — Ambulatory Visit (INDEPENDENT_AMBULATORY_CARE_PROVIDER_SITE_OTHER): Payer: 59 | Admitting: Certified Nurse Midwife

## 2022-07-31 VITALS — Ht 59.0 in | Wt 144.4 lb

## 2022-07-31 DIAGNOSIS — Z803 Family history of malignant neoplasm of breast: Secondary | ICD-10-CM

## 2022-07-31 DIAGNOSIS — R21 Rash and other nonspecific skin eruption: Secondary | ICD-10-CM | POA: Diagnosis not present

## 2022-07-31 DIAGNOSIS — Z1231 Encounter for screening mammogram for malignant neoplasm of breast: Secondary | ICD-10-CM

## 2022-07-31 NOTE — Progress Notes (Signed)
GYN ENCOUNTER NOTE  Subjective:       Alexis Snow is a 32 y.o. G39P1011 female is here for gynecologic evaluation of the following issues:  1. Breast changes, pt noticed some skin changes to left breast area. States that she has a burning sensation like a let down. She denies any nipple discharge or feeling any masses.      Gynecologic History No LMP recorded. Contraception: none Last Pap: 08/30/2019. Results were: normal Last mammogram: n/a . Ordered per pt request due to family history breast cancer.   Obstetric History OB History  Gravida Para Term Preterm AB Living  2 1 1   1 1   SAB IAB Ectopic Multiple Live Births    1     1    # Outcome Date GA Lbr Len/2nd Weight Sex Delivery Anes PTL Lv  2 Term 12/09/16 [redacted]w[redacted]d  8 lb (3.629 kg) M CS-Unspec  N LIV     Complications: Shoulder Dystocia  1 IAB 2013            Past Medical History:  Diagnosis Date   Anxiety    Gestational diabetes    Liver disease    OCD (obsessive compulsive disorder)    Postpartum depression    Urticaria    Vitamin D deficiency     Past Surgical History:  Procedure Laterality Date   ADENOIDECTOMY     CESAREAN SECTION N/A 12/09/2016   Procedure: CESAREAN SECTION;  Surgeon: Herold Harms, MD;  Location: [facility];  Service: Obstetrics;  Laterality: N/A;   ESOPHAGOGASTRODUODENOSCOPY (EGD) WITH PROPOFOL N/A 11/28/2020   Procedure: ESOPHAGOGASTRODUODENOSCOPY (EGD) WITH PROPOFOL;  Surgeon: Regis Bill, MD;  Location: [facility];  Service: Endoscopy;  Laterality: N/A;   TONSILLECTOMY     TONSILLECTOMY AND ADENOIDECTOMY      Current Outpatient Medications on File Prior to Visit  Medication Sig Dispense Refill   LORazepam (ATIVAN) 1 MG tablet Take 1 tablet (1 mg total) by mouth every 8 (eight) hours. 2 tablet 0   magnesium 30 MG tablet Take 30 mg by mouth 2 (two) times daily.     Probiotic Product (PROBIOTIC PO) Take by mouth.     Olopatadine-Mometasone (RYALTRIS) [id number] MCG/ACT SUSP  Place 1-2 sprays into the nose in the morning and at bedtime. (Patient not taking: Reported on 07/15/2022) 29 g 5   No current facility-administered medications on file prior to visit.    No Known Allergies  Social History   Socioeconomic History   Marital status: Married    Spouse name: Not on file   Number of children: Not on file   Years of education: Not on file   Highest education level: Not on file  Occupational History   Not on file  Tobacco Use   Smoking status: Never   Smokeless tobacco: Never  Vaping Use   Vaping Use: Never used  Substance and Sexual Activity   Alcohol use: Yes    Comment: occass   Drug use: No   Sexual activity: Yes    Birth control/protection: None  Other Topics Concern   Not on file  Social History Narrative   Not on file   Social Determinants of Health   Financial Resource Strain: Not on file  Food Insecurity: Not on file  Transportation Needs: Not on file  Physical Activity: Not on file  Stress: Not on file  Social Connections: Not on file  Intimate Partner Violence: Not on file    Family History  Problem Relation Age of Onset   Allergic rhinitis Mother    Allergic rhinitis Father    Cancer Maternal Grandmother    Heart disease Maternal Grandmother    Diabetes Maternal Grandmother    Liver disease Maternal Grandmother    Cancer Maternal Grandfather    Heart disease Paternal Grandfather    Breast cancer Neg Hx    Ovarian cancer Neg Hx    Colon cancer Neg Hx    Prostate cancer Neg Hx    Kidney cancer Neg Hx    Bladder Cancer Neg Hx    Asthma Neg Hx    Eczema Neg Hx    Urticaria Neg Hx     The following portions of the patient's history were reviewed and updated as appropriate: allergies, current medications, past family history, past medical history, past social history, past surgical history and problem list.  Review of Systems Review of Systems - Negative except as noted in HPI Review of Systems - General ROS:  negative for - chills, fatigue, fever, hot flashes, malaise or night sweats Hematological and Lymphatic ROS: negative for - bleeding problems or swollen lymph nodes Gastrointestinal ROS: negative for - abdominal pain, blood in stools, change in bowel habits and nausea/vomiting Musculoskeletal ROS: negative for - joint pain, muscle pain or muscular weakness Genito-Urinary ROS: negative for - change in menstrual cycle, dysmenorrhea, dyspareunia, dysuria, genital discharge, genital ulcers, hematuria, incontinence, irregular/heavy menses, nocturia or pelvic painjj  Objective:   Ht 4\' 11"  (1.499 m)   Wt 144 lb 6.4 oz (65.5 kg)   BMI 29.17 kg/m  CONSTITUTIONAL: Well-developed, well-nourished female in no acute distress.  HENT:  Normocephalic, atraumatic.  NECK: Normal range of motion, supple, no masses.  Normal thyroid.  SKIN: Skin is warm and dry. No rash noted. Not diaphoretic. No erythema. No pallor. NEUROLGIC: Alert and oriented to person, place, and time. PSYCHIATRIC: Normal mood and affect. Normal behavior. Normal judgment and thought content. CARDIOVASCULAR:Not Examined RESPIRATORY: Not Examined BREASTS: Breasts: breasts appear normal, no suspicious masses, no nipple changes or axillary nodes. Rosacea appearing rash at the top of both breast. More so on the right breast. Non tender, no itching, no dimpling of the skin or nipple. She denies any new products or change in any products.   ABDOMEN: Soft, non distended; Non tender.  No Organomegaly. PELVIC: not indicated  MUSCULOSKELETAL: Normal range of motion. No tenderness.  No cyanosis, clubbing, or edema.     Assessment:   Rash breast Screening mammogram    Plan:   Discussed seeing dermatologist if rash persists. Mammogram ordered for screening. Pt has family history of breast cancer and would like early screening. Will follow up with results.   Doreene BurkeAnnie Gabbriella Presswood, CNM

## 2022-08-24 ENCOUNTER — Encounter: Payer: Self-pay | Admitting: Certified Nurse Midwife

## 2022-08-24 ENCOUNTER — Other Ambulatory Visit (HOSPITAL_COMMUNITY)
Admission: RE | Admit: 2022-08-24 | Discharge: 2022-08-24 | Disposition: A | Discharge status: Home / Self Care | Payer: 59 | Source: Ambulatory Visit | Attending: Certified Nurse Midwife | Admitting: Certified Nurse Midwife

## 2022-08-24 ENCOUNTER — Ambulatory Visit (INDEPENDENT_AMBULATORY_CARE_PROVIDER_SITE_OTHER): Payer: 59 | Admitting: Certified Nurse Midwife

## 2022-08-24 VITALS — BP 101/70 | HR 92 | Resp 15 | Ht 59.0 in | Wt 144.5 lb

## 2022-08-24 DIAGNOSIS — Z124 Encounter for screening for malignant neoplasm of cervix: Secondary | ICD-10-CM

## 2022-08-24 DIAGNOSIS — Z01419 Encounter for gynecological examination (general) (routine) without abnormal findings: Secondary | ICD-10-CM | POA: Insufficient documentation

## 2022-08-24 DIAGNOSIS — Z113 Encounter for screening for infections with a predominantly sexual mode of transmission: Secondary | ICD-10-CM

## 2022-08-24 DIAGNOSIS — Z13220 Encounter for screening for lipoid disorders: Secondary | ICD-10-CM

## 2022-08-24 NOTE — Progress Notes (Signed)
GYNECOLOGY ANNUAL PREVENTATIVE CARE ENCOUNTER NOTE  History:     Alexis Snow is a 32 y.o. G22P1011 female here for a routine annual gynecologic exam.  Current complaints: none.   Denies abnormal vaginal bleeding, discharge, pelvic pain, problems with intercourse or other gynecologic concerns.     Social Relationship: Married  Living: spouse and child Work: Exercise: 3-4 week strength training  Smoke/Alcohol/drug ZOX:[id number] alcohol use   Gynecologic History Patient's last menstrual period was 08/07/2022 (exact date). Contraception:  natural family planning Last Pap: 08/30/2019. Results were: normal with negative HPV (pt request rpt today) Last mammogram: n/a .  Obstetric History OB History  Gravida Para Term Preterm AB Living  2 1 1   1 1   SAB IAB Ectopic Multiple Live Births    1     1    # Outcome Date GA Lbr Len/2nd Weight Sex Delivery Anes PTL Lv  2 Term 12/09/16 [redacted]w[redacted]d  8 lb (3.629 kg) M CS-Unspec  N LIV     Complications: Shoulder Dystocia  1 IAB 2013            Past Medical History:  Diagnosis Date   Anxiety    Gestational diabetes    Liver disease    OCD (obsessive compulsive disorder)    Postpartum depression    Urticaria    Vitamin D deficiency     Past Surgical History:  Procedure Laterality Date   ADENOIDECTOMY     CESAREAN SECTION N/A 12/09/2016   Procedure: CESAREAN SECTION;  Surgeon: Herold Harms, MD;  Location: [facility];  Service: Obstetrics;  Laterality: N/A;   ESOPHAGOGASTRODUODENOSCOPY (EGD) WITH PROPOFOL N/A 11/28/2020   Procedure: ESOPHAGOGASTRODUODENOSCOPY (EGD) WITH PROPOFOL;  Surgeon: Regis Bill, MD;  Location: [facility];  Service: Endoscopy;  Laterality: N/A;   TONSILLECTOMY     TONSILLECTOMY AND ADENOIDECTOMY      Current Outpatient Medications on File Prior to Visit  Medication Sig Dispense Refill   LORazepam (ATIVAN) 1 MG tablet Take 1 tablet (1 mg total) by mouth every 8 (eight) hours. 2 tablet 0    magnesium 30 MG tablet Take 30 mg by mouth 2 (two) times daily.     Probiotic Product (PROBIOTIC PO) Take by mouth.     Olopatadine-Mometasone (RYALTRIS) [id number] MCG/ACT SUSP Place 1-2 sprays into the nose in the morning and at bedtime. (Patient not taking: Reported on 07/15/2022) 29 g 5   No current facility-administered medications on file prior to visit.    Allergies  Allergen Reactions   Other Hives and Swelling    Mollusk    Shellfish Allergy Hives    Social History:  reports that she has never smoked. She has never used smokeless tobacco. She reports current alcohol use. She reports that she does not use drugs.  Family History  Problem Relation Age of Onset   Allergic rhinitis Mother    Allergic rhinitis Father    Cancer Maternal Grandmother    Heart disease Maternal Grandmother    Diabetes Maternal Grandmother    Liver disease Maternal Grandmother    Cancer Maternal Grandfather    Heart disease Paternal Grandfather    Breast cancer Neg Hx    Ovarian cancer Neg Hx    Colon cancer Neg Hx    Prostate cancer Neg Hx    Kidney cancer Neg Hx    Bladder Cancer Neg Hx    Asthma Neg Hx    Eczema Neg Hx  Urticaria Neg Hx     The following portions of the patient's history were reviewed and updated as appropriate: allergies, current medications, past family history, past medical history, past social history, past surgical history and problem list.  Review of Systems Pertinent items noted in HPI and remainder of comprehensive ROS otherwise negative.  Physical Exam:  BP 101/70   Pulse 92   Resp 15   Ht 4\' 11"  (1.499 m)   Wt 144 lb 8 oz (65.5 kg)   LMP 08/07/2022 (Exact Date)   BMI 29.19 kg/m  CONSTITUTIONAL: Well-developed, well-nourished female in no acute distress.  HENT:  Normocephalic, atraumatic, External right and left ear normal. Oropharynx is clear and moist EYES: Conjunctivae and EOM are normal. Pupils are equal, round, and reactive to light. No scleral  icterus.  NECK: Normal range of motion, supple, no masses.  Normal thyroid.  SKIN: Skin is warm and dry. No rash noted. Not diaphoretic. No erythema. No pallor. MUSCULOSKELETAL: Normal range of motion. No tenderness.  No cyanosis, clubbing, or edema.  2+ distal pulses. NEUROLOGIC: Alert and oriented to person, place, and time. Normal reflexes, muscle tone coordination.  PSYCHIATRIC: Normal mood and affect. Normal behavior. Normal judgment and thought content. CARDIOVASCULAR: Normal heart rate noted, regular rhythm RESPIRATORY: Clear to auscultation bilaterally. Effort and breath sounds normal, no problems with respiration noted. BREASTS: Symmetric in size. No masses, tenderness, skin changes, nipple drainage, or lymphadenopathy bilaterally.  ABDOMEN: Soft, no distention noted.  No tenderness, rebound or guarding.  PELVIC: Normal appearing external genitalia and urethral meatus; normal appearing vaginal mucosa and cervix.  No abnormal discharge noted.  Pap smear obtained.  Normal uterine size, no other palpable masses, no uterine or adnexal tenderness.  .   Assessment and Plan:    1. Women's annual routine gynecological examination    Pap: Will follow up results of pap smear and manage accordingly. Mammogram : n/a Labs:lipid, Hep c  Refills: none Referral: none  Routine preventative health maintenance measures emphasized. Please refer to After Visit Summary for other counseling recommendations.      Doreene Burke, CNM [facility]  [facility],  [facility]

## 2022-08-25 LAB — LIPID PANEL
Chol/HDL Ratio: 2.4 ratio (ref 0.0–4.4)
Cholesterol, Total: 148 mg/dL (ref 100–199)
HDL: 62 mg/dL (ref >39)
LDL Chol Calc (NIH): 57 mg/dL (ref 0–99)
Triglycerides: 174 mg/dL — ABNORMAL HIGH (ref 0–149)
VLDL Cholesterol Cal: 29 mg/dL (ref 5–40)

## 2022-08-25 LAB — HEPATITIS C ANTIBODY: Hep C Virus Ab: NONREACTIVE

## 2022-08-26 ENCOUNTER — Encounter: Payer: Self-pay | Admitting: Certified Nurse Midwife

## 2022-08-26 LAB — CYTOLOGY - PAP
Adequacy: ABSENT
Comment: NEGATIVE
Diagnosis: NEGATIVE
High risk HPV: NEGATIVE

## 2022-09-08 IMAGING — US US LIVER ELASTOGRAPHY
1 series · 12 of 25 positions shown · non-contrast
Comparison: None.

CLINICAL DATA: Fatty liver

EXAM:
US LIVER ELASTOGRAPHY
TECHNIQUE: Sonography of the liver was performed. In addition, ultrasound
elastography evaluation of the liver was performed. A region of
interest was placed within the right lobe of the liver. Following
application of a compressive sonographic pulse, tissue
compressibility was assessed. Multiple assessments were performed at
the selected site. Median tissue compressibility was determined.
Previously, hepatic stiffness was assessed by shear wave velocity.
Based on recently published Society of Radiologists in Ultrasound
consensus article, reporting is now recommended to be performed in
the SI units of pressure (kiloPascals) representing hepatic
stiffness/elasticity. The obtained result is compared to the
published reference standards. (cACLD = compensated Advanced Chronic
Liver Disease)

[Series 1: us elastography liver · 12 of 33 slices shown]
[im 2/33]
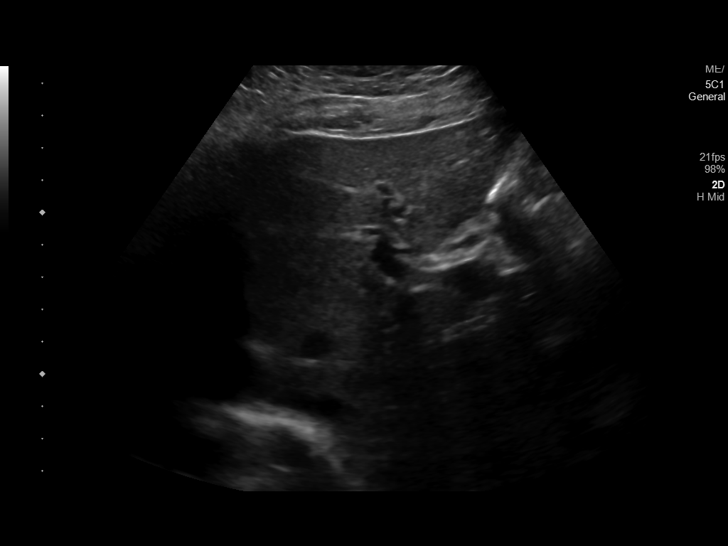
[im 5/33]
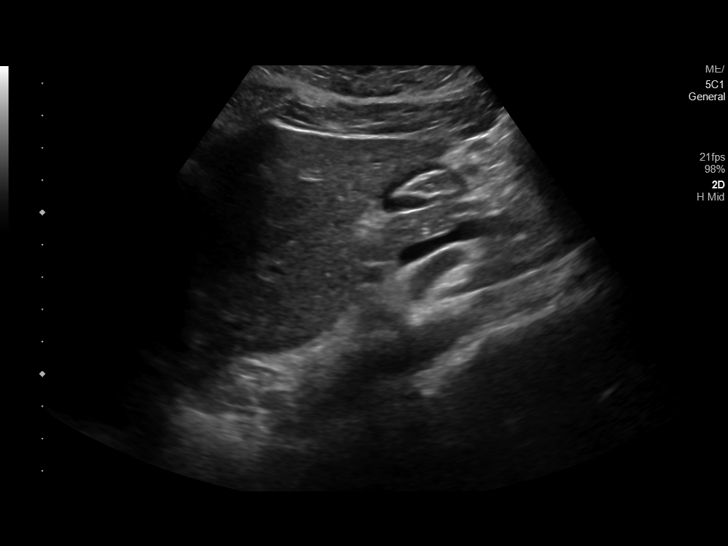
[im 7/33]
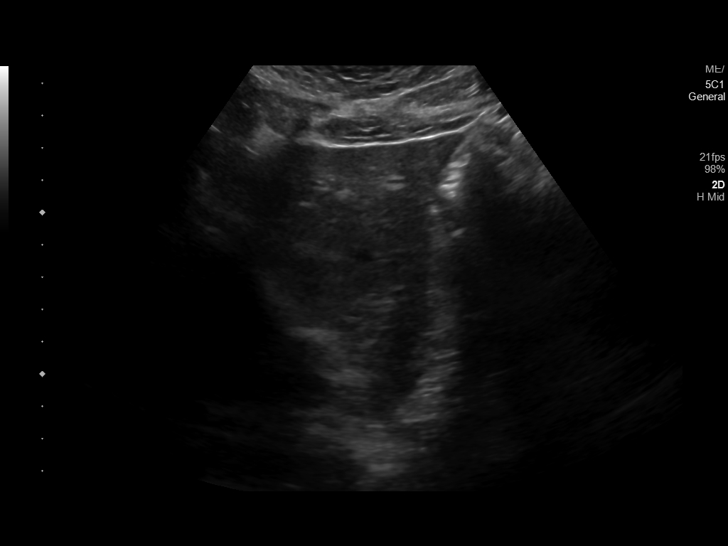
[im 10/33]
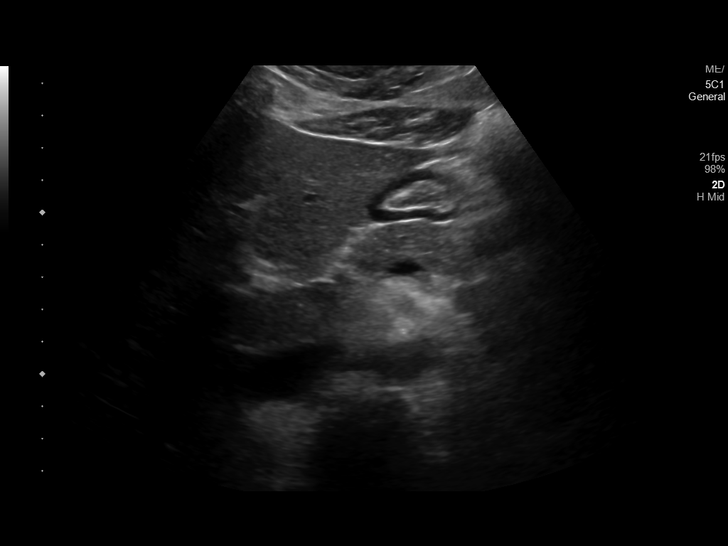
[im 13/33]
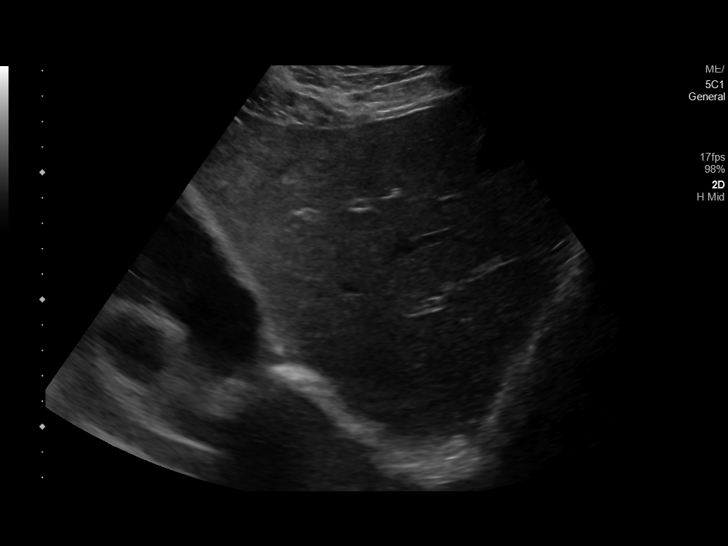
[im 15/33]
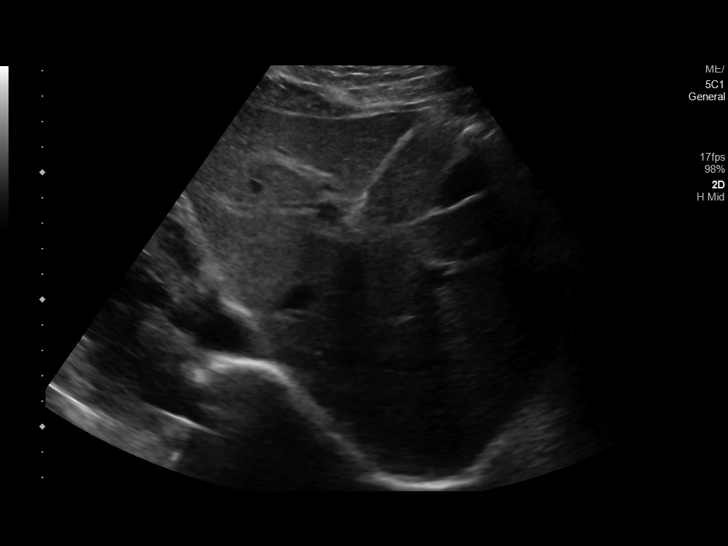
[im 18/33]
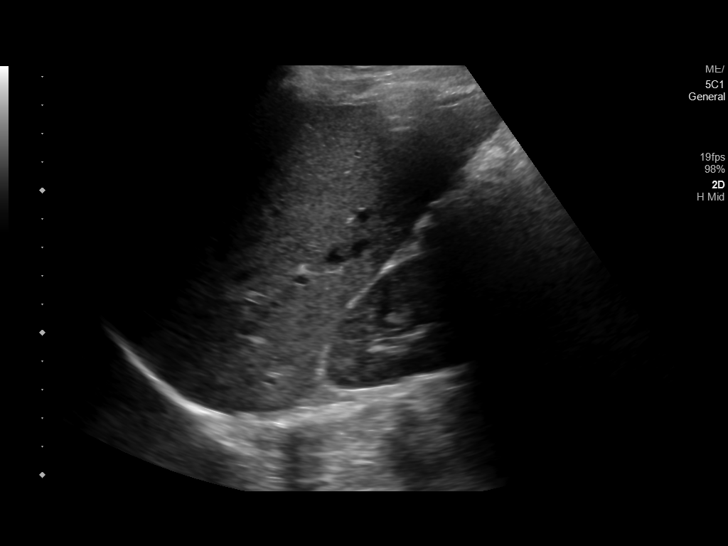
[im 21/33]
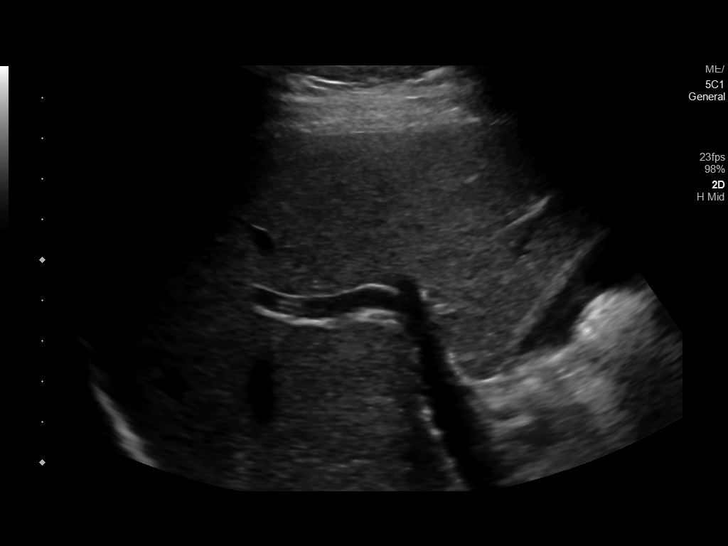
[im 23/33]
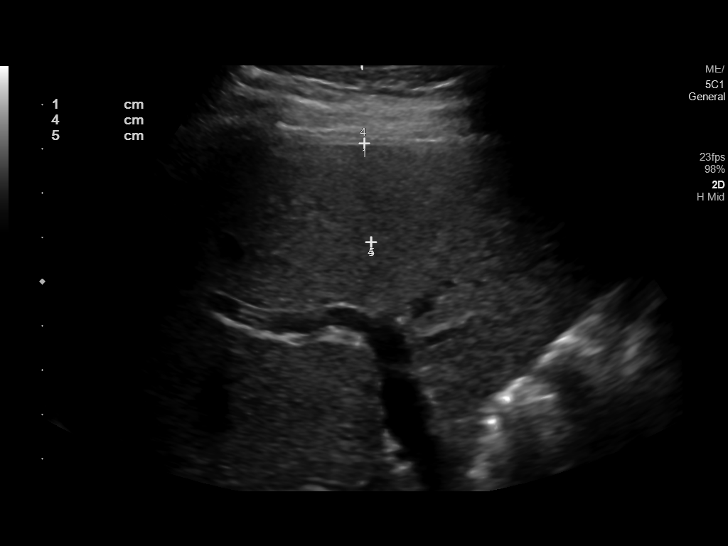
[im 26/33]
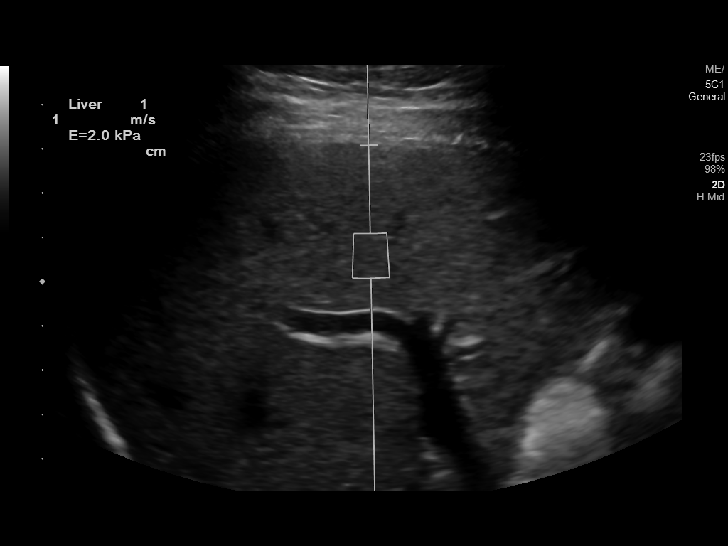
[im 29/33]
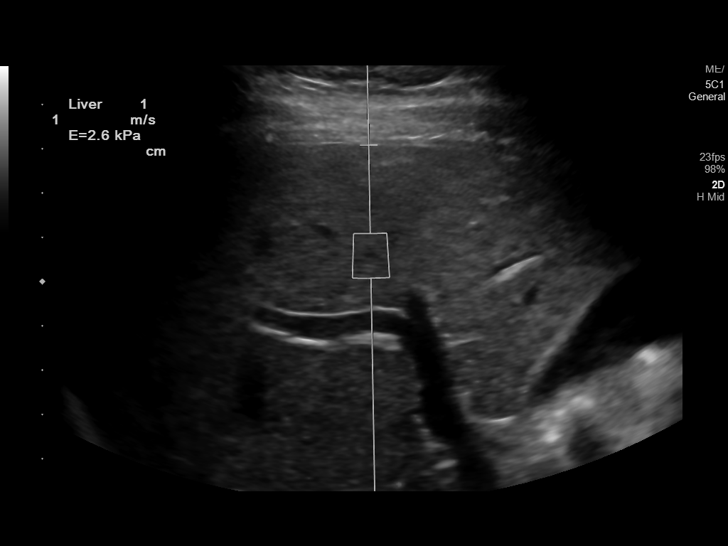
[im 31/33]
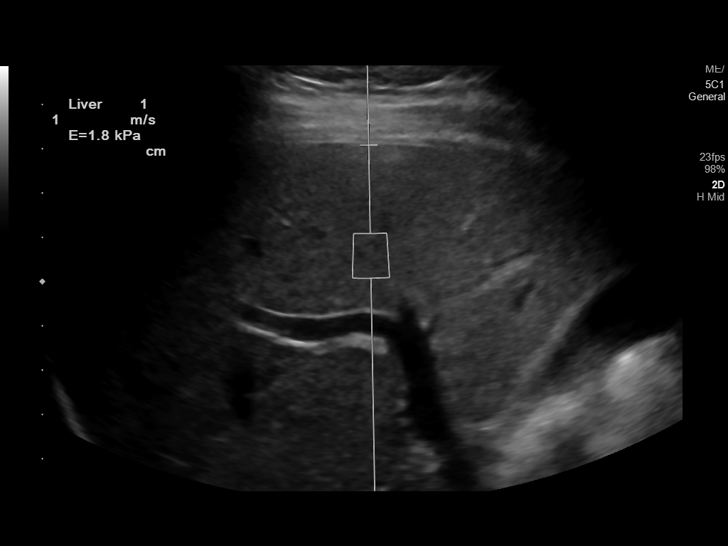

[12 of 25 positions shown; findings below may reference images not displayed]

FINDINGS: Liver: No focal lesion identified. Within normal limits in
parenchymal echogenicity. Portal vein is patent on color Doppler
imaging with normal direction of blood flow towards the liver.

ULTRASOUND HEPATIC ELASTOGRAPHY

Device: Siemens Helix VTQ

Patient position: Supine

Transducer: [id number]

Number of measurements: 10

Hepatic segment:  8

Median kPa:

IQR:

IQR/Median kPa ratio:

Data quality:  Good

Diagnostic category:  < or = 5 kPa: high probability of being normal

The use of hepatic elastography is applicable to patients with viral
hepatitis and non-alcoholic fatty liver disease. At this time, there
is insufficient data for the referenced cut-off values and use in
other causes of liver disease, including alcoholic liver disease.
Patients, however, may be assessed by elastography and serve as
their own reference standard/baseline.

In patients with non-alcoholic liver disease, the values suggesting
compensated advanced chronic liver disease (cACLD) may be lower, and
patients may need additional testing with elasticity results of [DATE]
kPa.

Please note that abnormal hepatic elasticity and shear wave
velocities may also be identified in clinical settings other than
with hepatic fibrosis, such as: acute hepatitis, elevated right
heart and central venous pressures including use of beta blockers,
Baale disease ([place]), infiltrative processes such as
mastocytosis/amyloidosis/infiltrative tumor/lymphoma, extrahepatic
cholestasis, with hyperemia in the post-prandial state, and with
liver transplantation. Correlation with patient history, laboratory
data, and clinical condition recommended.

Diagnostic Categories:

< or =5 kPa: high probability of being normal

< or =9 kPa: in the absence of other known clinical signs, rules [DATE] kPa and ?13 kPa: suggestive of cACLD, but needs further testing

>13 kPa: highly suggestive of cACLD

> or =17 kPa: highly suggestive of cACLD with an increased
probability of clinically significant portal hypertension
IMPRESSION: ULTRASOUND LIVER:

Unremarkable

ULTRASOUND HEPATIC ELASTOGRAPHY:

Median kPa:

Diagnostic category:  < or = 5 kPa: high probability of being normal

## 2022-09-11 ENCOUNTER — Emergency Department
Admission: EM | Admit: 2022-09-11 | Discharge: 2022-09-11 | Disposition: A | Discharge status: Home / Self Care | Payer: 59 | Attending: Emergency Medicine | Admitting: Emergency Medicine

## 2022-09-11 ENCOUNTER — Emergency Department: Payer: 59

## 2022-09-11 ENCOUNTER — Other Ambulatory Visit: Payer: Self-pay

## 2022-09-11 ENCOUNTER — Encounter: Payer: Self-pay | Admitting: Emergency Medicine

## 2022-09-11 DIAGNOSIS — R059 Cough, unspecified: Secondary | ICD-10-CM | POA: Insufficient documentation

## 2022-09-11 DIAGNOSIS — R0789 Other chest pain: Secondary | ICD-10-CM | POA: Diagnosis not present

## 2022-09-11 DIAGNOSIS — R0981 Nasal congestion: Secondary | ICD-10-CM | POA: Diagnosis not present

## 2022-09-11 DIAGNOSIS — R5383 Other fatigue: Secondary | ICD-10-CM | POA: Diagnosis present

## 2022-09-11 DIAGNOSIS — J111 Influenza due to unidentified influenza virus with other respiratory manifestations: Secondary | ICD-10-CM

## 2022-09-11 MED ORDER — AMOXICILLIN 875 MG PO TABS: 875.0000 mg | ORAL_TABLET | Freq: Two times a day (BID) | ORAL | 0 refills | Status: AC

## 2022-09-11 NOTE — ED Triage Notes (Addendum)
Pt here with cp. Pt states she went to UC on Tues and was given a muscle relaxer and abx. Pt states the abx is making her groggy and loopy and with chest tightness. Pt also has a productive cough. Pt states she just needs a new abx, does not think it is heart related.

## 2022-09-11 NOTE — ED Notes (Signed)
Pt verbalizes understanding of discharge instructions. Opportunity for questioning and answers were provided. Pt discharged from ED to home with family.    

## 2022-09-11 NOTE — ED Provider Notes (Signed)
[facility] Provider Note    Event Date/Time   First MD Initiated Contact with Patient 09/11/22 1337     (approximate)   History   Chest Pain   HPI  Alexis Snow is a 32 y.o. female with no significant past medical history who comes the ED complaining of fatigue and feeling somewhat groggy over the past 3 days.  Also has a nonproductive cough.  No fever or chills.  She does report some occasional right posterolateral chest tightness that feels muscular that was happening yesterday but is resolved.  Reviewed outside records.  She was seen at [facility] clinic 3 days ago, diagnosed with sinus infection, prescribed baclofen and Omnicef.  She reports she took 1/2 tablet of baclofen once that night, and many of her symptoms felt better and she has not taken it since.  However, she has been taking the [facility] and notes that she seems to feel worse soon after taking each dose, so she called her doctor to request that the antibiotic be changed.  They instructed her to come to the ED instead for evaluation.  Denies any allergic reaction symptoms.  Patient also notes that her son was sick with an upper respiratory infection for a few days prior to the onset of her symptoms.     Physical Exam   Triage Vital Signs: ED Triage Vitals  Enc Vitals Group     BP 09/11/22 1120 (!) 123/92     Pulse Rate 09/11/22 1120 92     Resp 09/11/22 1120 18     Temp 09/11/22 1120 98.2 F (36.8 C)     Temp Source 09/11/22 1120 Oral     SpO2 09/11/22 1120 98 %     Weight 09/11/22 1121 144 lb 6.4 oz (65.5 kg)     Height 09/11/22 1121 4\' 11"  (1.499 m)     Head Circumference --      Peak Flow --      Pain Score 09/11/22 1120 4     Pain Loc --      Pain Edu? --      Excl. in GC? --     Most recent vital signs: Vitals:   09/11/22 1120  BP: (!) 123/92  Pulse: 92  Resp: 18  Temp: 98.2 F (36.8 C)  SpO2: 98%    General: Awake, no distress.  CV:  Good peripheral perfusion.   Regular rate and rhythm Resp:  Normal effort.  Clear to auscultation bilaterally Abd:  No distention.  Other:  Right posterior lateral chest wall with mild muscular tenderness.   ED Results / Procedures / Treatments   Labs (all labs ordered are listed, but only abnormal results are displayed) Labs Reviewed - No data to display  EKG interpreted by me,  Normal sinus rhythm rate of 89.  Normal axis intervals QRS ST segments and T waves.  RADIOLOGY Chest x-ray interpreted by me, appears normal.  Radiology report reviewed.   PROCEDURES:  Procedures   MEDICATIONS ORDERED IN ED: Medications - No data to display   IMPRESSION / MDM / ASSESSMENT AND PLAN / ED COURSE  I reviewed the triage vital signs and the nursing notes.                              Differential diagnosis includes, but is not limited to, viral respiratory infection, pneumonia, pneumothorax, pleural effusion    Patient presents with fatigue, most  likely due to influenza-like illness.  Chest x-ray is unremarkable.  Vitals are normal.  Sinus symptoms have resolved except for some nasal congestion.  She plan to discontinue Omnicef, I will provide a prescription for amoxicillin to take if symptoms do not continue improving.       FINAL CLINICAL IMPRESSION(S) / ED DIAGNOSES   Final diagnoses:  Influenza-like illness     Rx / DC Orders   ED Discharge Orders          Ordered    amoxicillin (AMOXIL) 875 MG tablet  2 times daily        09/11/22 1350             Note:  This document was prepared using Dragon voice recognition software and may include unintentional dictation errors.   Sharman Cheek, MD 09/11/22 1400

## 2022-09-16 ENCOUNTER — Other Ambulatory Visit: Payer: Self-pay | Admitting: Certified Nurse Midwife

## 2022-09-16 DIAGNOSIS — R234 Changes in skin texture: Secondary | ICD-10-CM

## 2022-09-16 DIAGNOSIS — R52 Pain, unspecified: Secondary | ICD-10-CM

## 2022-10-14 ENCOUNTER — Ambulatory Visit (INDEPENDENT_AMBULATORY_CARE_PROVIDER_SITE_OTHER): Payer: 59 | Admitting: Dermatology

## 2022-10-14 VITALS — BP 105/70

## 2022-10-14 DIAGNOSIS — D225 Melanocytic nevi of trunk: Secondary | ICD-10-CM | POA: Diagnosis not present

## 2022-10-14 DIAGNOSIS — D492 Neoplasm of unspecified behavior of bone, soft tissue, and skin: Secondary | ICD-10-CM

## 2022-10-14 DIAGNOSIS — Z7189 Other specified counseling: Secondary | ICD-10-CM

## 2022-10-14 DIAGNOSIS — A6920 Lyme disease, unspecified: Secondary | ICD-10-CM | POA: Diagnosis not present

## 2022-10-14 DIAGNOSIS — Z808 Family history of malignant neoplasm of other organs or systems: Secondary | ICD-10-CM

## 2022-10-14 DIAGNOSIS — R21 Rash and other nonspecific skin eruption: Secondary | ICD-10-CM

## 2022-10-14 DIAGNOSIS — L700 Acne vulgaris: Secondary | ICD-10-CM | POA: Diagnosis not present

## 2022-10-14 DIAGNOSIS — L738 Other specified follicular disorders: Secondary | ICD-10-CM | POA: Diagnosis not present

## 2022-10-14 DIAGNOSIS — D239 Other benign neoplasm of skin, unspecified: Secondary | ICD-10-CM

## 2022-10-14 HISTORY — DX: Other benign neoplasm of skin, unspecified: D23.9

## 2022-10-14 MED ORDER — DOXYCYCLINE MONOHYDRATE 100 MG PO CAPS: 100.0000 mg | ORAL_CAPSULE | Freq: Two times a day (BID) | ORAL | 0 refills | Status: AC

## 2022-10-14 NOTE — Patient Instructions (Addendum)
Wound Care Instructions  Cleanse wound gently with soap and water once a day then pat dry with clean gauze. Apply a thin coat of Petrolatum (petroleum jelly, "Vaseline") over the wound (unless you have an allergy to this). We recommend that you use a new, sterile tube of Vaseline. Do not pick or remove scabs. Do not remove the yellow or white "healing tissue" from the base of the wound.  Cover the wound with fresh, clean, nonstick gauze and secure with paper tape. You may use Band-Aids in place of gauze and tape if the wound is small enough, but would recommend trimming much of the tape off as there is often too much. Sometimes Band-Aids can irritate the skin.  You should call the office for your biopsy report after 1 week if you have not already been contacted.  If you experience any problems, such as abnormal amounts of bleeding, swelling, significant bruising, significant pain, or evidence of infection, please call the office immediately.  FOR ADULT SURGERY PATIENTS: If you need something for pain relief you may take 1 extra strength Tylenol (acetaminophen) AND 2 Ibuprofen (200mg  each) together every 4 hours as needed for pain. (do not take these if you are allergic to them or if you have a reason you should not take them.) Typically, you may only need pain medication for 1 to 3 days.   Start Cabtreo to spot on right check nightly  Start Neosporin to biopsy site until healed  Doxycycline should be taken with food to prevent nausea. Do not lay down for 30 minutes after taking. Be cautious with sun exposure and use good sun protection while on this medication. Pregnant women should not take this medication.    Due to recent changes in healthcare laws, you may see results of your pathology and/or laboratory studies on MyChart before the doctors have had a chance to review them. We understand that in some cases there may be results that are confusing or concerning to you. Please understand that not  all results are received at the same time and often the doctors may need to interpret multiple results in order to provide you with the best plan of care or course of treatment. Therefore, we ask that you please give [place] 2 business days to thoroughly review all your results before contacting the office for clarification. Should we see a critical lab result, you will be contacted sooner.   If You Need Anything After Your Visit  If you have any questions or concerns for your doctor, please call our main line at [phone] and press option 4 to reach your doctor's medical assistant. If no one answers, please leave a voicemail as directed and we will return your call as soon as possible. Messages left after 4 pm will be answered the following business day.   You may also send [place] a message via MyChart. We typically respond to MyChart messages within 1-2 business days.  For prescription refills, please ask your pharmacy to contact our office. Our fax number is [phone].  If you have an urgent issue when the clinic is closed that cannot wait until the next business day, you can page your doctor at the number below.    Please note that while we do our best to be available for urgent issues outside of office hours, we are not available 24/7.   If you have an urgent issue and are unable to reach [place], you may choose to seek medical care at your doctor's  office, retail clinic, urgent care center, or emergency room.  If you have a medical emergency, please immediately call 911 or go to the emergency department.  Pager Numbers  - Dr. Gwen Pounds: [phone]  - Dr. Neale Burly: [phone]  - Dr. Roseanne Reno: [phone]  In the event of inclement weather, please call our main line at [phone] for an update on the status of any delays or closures.  Dermatology Medication Tips: Please keep the boxes that topical medications come in in order to help keep track of the instructions about where and how to  use these. Pharmacies typically print the medication instructions only on the boxes and not directly on the medication tubes.   If your medication is too expensive, please contact our office at [phone] option 4 or send [place] a message through MyChart.   We are unable to tell what your co-pay for medications will be in advance as this is different depending on your insurance coverage. However, we may be able to find a substitute medication at lower cost or fill out paperwork to get insurance to cover a needed medication.   If a prior authorization is required to get your medication covered by your insurance company, please allow [place] 1-2 business days to complete this process.  Drug prices often vary depending on where the prescription is filled and some pharmacies may offer cheaper prices.  The website www.goodrx.com contains coupons for medications through different pharmacies. The prices here do not account for what the cost may be with help from insurance (it may be cheaper with your insurance), but the website can give you the price if you did not use any insurance.  - You can print the associated coupon and take it with your prescription to the pharmacy.  - You may also stop by our office during regular business hours and pick up a GoodRx coupon card.  - If you need your prescription sent electronically to a different pharmacy, notify our office through [facility] or by phone at [phone] option 4.     Si Usted Necesita Algo Despus de Su Visita  Tambin puede enviarnos un mensaje a travs de Clinical cytogeneticist. Por lo general respondemos a los mensajes de MyChart en el transcurso de 1 a 2 das hbiles.  Para renovar recetas, por favor pida a su farmacia que se ponga en contacto con nuestra oficina. Annie Sable de fax es [place] [phone].  Si tiene un asunto urgente cuando la clnica est cerrada y que no puede esperar hasta el siguiente da hbil, puede llamar/localizar a su  doctor(a) al nmero que aparece a continuacin.   Por favor, tenga en cuenta que aunque hacemos todo lo posible para estar disponibles para asuntos urgentes fuera del horario de [place], no estamos disponibles las 24 horas del da, los 7 [address]  [address] de la [place].   Si tiene un problema urgente y no puede comunicarse con nosotros, puede optar por buscar atencin mdica  en el consultorio de su doctor(a), en una clnica privada, en un centro de atencin urgente o en una sala de emergencias.  Si tiene Engineer, drilling, por favor llame inmediatamente al 911 o vaya a la sala de emergencias.  Nmeros de bper  - Dr. Gwen Pounds: [phone]  - Dra. Moye: [phone]  - Dra. Roseanne Reno: [phone]  En caso de inclemencias del [place], por favor llame a Lacy Duverney principal al [phone] para una actualizacin sobre el [place] de cualquier retraso o cierre.  Consejos para la medicacin en dermatologa: Por favor,  guarde las cajas en las que vienen los medicamentos de uso tpico para ayudarle a seguir las instrucciones sobre dnde y cmo usarlos. Las farmacias generalmente imprimen las instrucciones del medicamento slo en las cajas y no directamente en los tubos del [place].   Si su medicamento es muy caro, por favor, pngase en contacto con Rolm Gala llamando al [phone] y presione la opcin 4 o envenos un mensaje a travs de Clinical cytogeneticist.   No podemos decirle cul ser su copago por los medicamentos por adelantado ya que esto es diferente dependiendo de la cobertura de su seguro. Sin embargo, es posible que podamos encontrar un medicamento sustituto a Audiological scientist un formulario para que el seguro cubra el medicamento que se considera necesario.   Si se requiere una autorizacin previa para que su compaa de seguros [place] su medicamento, por favor permtanos de 1 a 2 das hbiles para completar [address]  Los precios de los medicamentos varan con frecuencia dependiendo del  Environmental consultant de dnde se surte la receta y alguna farmacias pueden ofrecer precios ms baratos.  El sitio web www.goodrx.com tiene cupones para medicamentos de Health and safety inspector. Los precios aqu no tienen en cuenta lo que podra costar con la ayuda del seguro (puede ser ms barato con su seguro), pero el sitio web puede darle el precio si no utiliz Tourist information centre manager.  - Puede imprimir el cupn correspondiente y llevarlo con su receta a la farmacia.  - Tambin puede pasar por nuestra oficina durante el horario de atencin regular y Education officer, museum una tarjeta de cupones de GoodRx.  - Si necesita que su receta se enve electrnicamente a una farmacia diferente, informe a nuestra oficina a travs de MyChart de [place] o por telfono llamando al [phone] y presione la opcin 4.

## 2022-10-14 NOTE — Progress Notes (Signed)
   Follow-Up Visit   Subjective  Alexis Snow is a 32 y.o. female who presents for the following: check spot face, 2-3 wks, no symptoms, check freckle on chest, <73yr, no symptoms, check dark spot L breast 41m, no symptoms, fhx of skin ca,not sure type The patient has spots, moles and lesions to be evaluated, some may be new or changing and the patient may have concern these could be cancer.  The following portions of the chart were reviewed this encounter and updated as appropriate: medications, allergies, medical history  Review of Systems:  No other skin or systemic complaints except as noted in HPI or Assessment and Plan.  Objective  Well appearing patient in no apparent distress; mood and affect are within normal limits.  A focused examination was performed of the following areas: Face, chest, left breast  Relevant exam findings are noted in the Assessment and Plan.  L xyphoid 0.3cm dark brown macule      L breast  Assessment & Plan   FAMILY HISTORY OF SKIN CANCER What type(s):not sure Who affected:Mother   Sebaceous Hyperplasia - Small yellow papules with a central dell - Benign-appearing - Observe. Call for changes.  - face  Neoplasm of skin L xyphoid  Epidermal / dermal shaving  Lesion diameter (cm):  0.3 Informed consent: discussed and consent obtained   Timeout: patient name, date of birth, surgical site, and procedure verified   Procedure prep:  Patient was prepped and draped in usual sterile fashion Prep type:  Isopropyl alcohol Anesthesia: the lesion was anesthetized in a standard fashion   Anesthetic:  1% lidocaine w/ epinephrine 1-100,000 buffered w/ 8.4% NaHCO3 Instrument used: flexible razor blade   Hemostasis achieved with: pressure, aluminum chloride and electrodesiccation   Outcome: patient tolerated procedure well   Post-procedure details: sterile dressing applied and wound care instructions given   Dressing type: bandage and petrolatum     Specimen 1 - Surgical pathology Differential Diagnosis: D48.5 Nevus vs Dysplastic Nevus  Check Margins: yes 0.3cm dark brown macule   ACNE VULGARIS Exam: 5mm pink area slight induration R lat nose/cheek Treatment Plan: Start Cabtreo gel at bedtime, sample x 1, Lot [id number] exp 11/02/22  Topical retinoid medications like tretinoin/Retin-A, adapalene/Differin, tazarotene/Fabior, and Epiduo/Epiduo Forte can cause dryness and irritation when first started. Only apply a pea-sized amount to the entire affected area. Avoid applying it around the eyes, edges of mouth and creases at the nose. If you experience irritation, use a good moisturizer first and/or apply the medicine less often. If you are doing well with the medicine, you can increase how often you use it until you are applying every night. Be careful with sun protection while using this medication as it can make you sensitive to the sun. This medicine should not be used by pregnant women.    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 Do not lay down for 30 minutes after taking. Be cautious with sun exposure and use good sun protection while on this medication. Pregnant women should not take this medication.    Return if symptoms worsen or fail to improve.  I, Ardis Rowan, RMA, am acting as scribe for Armida Sans, MD .  Documentation: I have reviewed the above documentation for accuracy and completeness, and I agree with the above.  Armida Sans, MD

## 2022-10-16 ENCOUNTER — Encounter: Payer: Self-pay | Admitting: Dermatology

## 2022-10-20 ENCOUNTER — Telehealth: Payer: Self-pay

## 2022-10-20 NOTE — Telephone Encounter (Signed)
Left pt msg to call for bx results/sh 

## 2022-10-20 NOTE — Telephone Encounter (Signed)
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  I recommend she make appt for 1 year - Please make appt for pt

## 2022-10-28 ENCOUNTER — Telehealth: Payer: Self-pay

## 2022-10-28 NOTE — Telephone Encounter (Signed)
Advised pt of bx results/sh ?

## 2022-10-28 NOTE — Telephone Encounter (Signed)
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  I recommend she make appt for 1 year - Please make appt for pt 

## 2023-10-06 ENCOUNTER — Ambulatory Visit: Payer: 59 | Admitting: Dermatology

## 2023-10-27 ENCOUNTER — Ambulatory Visit (INDEPENDENT_AMBULATORY_CARE_PROVIDER_SITE_OTHER): Admitting: Dermatology

## 2023-10-27 DIAGNOSIS — L814 Other melanin hyperpigmentation: Secondary | ICD-10-CM

## 2023-10-27 DIAGNOSIS — R21 Rash and other nonspecific skin eruption: Secondary | ICD-10-CM

## 2023-10-27 DIAGNOSIS — Z79899 Other long term (current) drug therapy: Secondary | ICD-10-CM

## 2023-10-27 DIAGNOSIS — L821 Other seborrheic keratosis: Secondary | ICD-10-CM

## 2023-10-27 DIAGNOSIS — L578 Other skin changes due to chronic exposure to nonionizing radiation: Secondary | ICD-10-CM

## 2023-10-27 DIAGNOSIS — D225 Melanocytic nevi of trunk: Secondary | ICD-10-CM

## 2023-10-27 DIAGNOSIS — I8393 Asymptomatic varicose veins of bilateral lower extremities: Secondary | ICD-10-CM

## 2023-10-27 DIAGNOSIS — W908XXA Exposure to other nonionizing radiation, initial encounter: Secondary | ICD-10-CM

## 2023-10-27 DIAGNOSIS — D229 Melanocytic nevi, unspecified: Secondary | ICD-10-CM

## 2023-10-27 DIAGNOSIS — Z86018 Personal history of other benign neoplasm: Secondary | ICD-10-CM

## 2023-10-27 DIAGNOSIS — Z1283 Encounter for screening for malignant neoplasm of skin: Secondary | ICD-10-CM | POA: Diagnosis not present

## 2023-10-27 DIAGNOSIS — L743 Miliaria, unspecified: Secondary | ICD-10-CM

## 2023-10-27 DIAGNOSIS — Z7189 Other specified counseling: Secondary | ICD-10-CM

## 2023-10-27 MED ORDER — MOMETASONE FUROATE 0.1 % EX CREA
TOPICAL_CREAM | CUTANEOUS | 0 refills | Status: AC
Start: 2023-10-27 — End: ?

## 2023-10-27 NOTE — Progress Notes (Signed)
 Follow-Up Visit   Subjective  Alexis Snow is a 33 y.o. female who presents for the following: Skin Cancer Screening and Full Body Skin Exam Hx of dysplastic nevus, hx of acne, reports not bothered by acne and not using any treatments.   Also reports a history of rash at chest and back that happened after a trip to the beach 3 weeks ago.   The patient presents for Total-Body Skin Exam (TBSE) for skin cancer screening and mole check. The patient has spots, moles and lesions to be evaluated, some may be new or changing and the patient may have concern these could be cancer.  The following portions of the chart were reviewed this encounter and updated as appropriate: medications, allergies, medical history  Review of Systems:  No other skin or systemic complaints except as noted in HPI or Assessment and Plan.  Objective  Well appearing patient in no apparent distress; mood and affect are within normal limits.  A full examination was performed including scalp, head, eyes, ears, nose, lips, neck, chest, axillae, abdomen, back, buttocks, bilateral upper extremities, bilateral lower extremities, hands, feet, fingers, toes, fingernails, and toenails. All findings within normal limits unless otherwise noted below.   Relevant physical exam findings are noted in the Assessment and Plan.    Assessment & Plan   SKIN CANCER SCREENING PERFORMED TODAY.  ACTINIC DAMAGE - Chronic condition, secondary to cumulative UV/sun exposure - diffuse scaly erythematous macules with underlying dyspigmentation - Recommend daily broad spectrum sunscreen SPF 30+ to sun-exposed areas, reapply every 2 hours as needed.  - Staying in the shade or wearing long sleeves, sun glasses (UVA+UVB protection) and wide brim hats (4-inch brim around the entire circumference of the hat) are also recommended for sun protection.  - Call for new or changing lesions.  LENTIGINES, SEBORRHEIC KERATOSES, HEMANGIOMAS - Benign normal  skin lesions - Benign-appearing - Call for any changes  MELANOCYTIC NEVI - Tan-brown and/or pink-flesh-colored symmetric macules and papules - Benign appearing on exam today - Observation - Call clinic for new or changing moles - Recommend daily use of broad spectrum spf 30+ sunscreen to sun-exposed areas.  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   Benign-appearing. Stable compared to previous visit. Observation.  Call clinic for new or changing moles.  Recommend daily use of broad spectrum spf 30+ sunscreen to sun-exposed areas.   RASH MILIARIA VS CONTACT DERMATITIS  Exam: multiple inflamed red papules at chest see photo Treatment Plan: Hx of rash after trip to beach 3 weeks ago  Start mometasone  0.1 % cream apply topically to rash daily as needed up to 5 days a week until clear  Avoid applying to face, groin, and axilla. Use as directed. Long-term use can cause thinning of the skin.  Topical steroids (such as triamcinolone, fluocinolone, fluocinonide, mometasone , clobetasol, halobetasol, betamethasone, hydrocortisone) can cause thinning and lightening of the skin if they are used for too long in the same area. Your physician has selected the right strength medicine for your problem and area affected on the body. Please use your medication only as directed by your physician to prevent side effects.   Varicose Veins/Spider Veins - Dilated blue, purple or red veins at the lower extremities - Reassured - Smaller vessels can be treated by sclerotherapy (a procedure to inject a medicine  into the veins to make them disappear) if desired, but the treatment is not covered by insurance. Larger vessels may be covered if symptomatic and we would refer to vascular surgeon if treatment desired.  HISTORY OF DYSPLASTIC NEVUS 10/14/2022 left xyphoid -  moderate atypia margins free (clear today) No evidence of recurrence today Recommend regular full body skin exams Recommend daily broad spectrum sunscreen SPF 30+ to sun-exposed areas, reapply every 2 hours as needed.  Call if any new or changing lesions are noted between office visits  MILIARIA   Related Medications mometasone  (ELOCON ) 0.1 % cream Apply topically to rash area at chest / back qd up to 5 days a week as needed until clear Avoid applying to face, groin, and axilla. Use as directed. Return in about 1 year (around 10/26/2024) for TBSE.  IEleanor Blush, CMA, am acting as scribe for Alm Rhyme, MD.   Documentation: I have reviewed the above documentation for accuracy and completeness, and I agree with the above.  Alm Rhyme, MD

## 2023-10-27 NOTE — Patient Instructions (Signed)

## 2023-11-02 ENCOUNTER — Encounter: Payer: Self-pay | Admitting: Dermatology

## 2024-01-25 ENCOUNTER — Ambulatory Visit: Admitting: Dermatology

## 2024-01-25 DIAGNOSIS — L309 Dermatitis, unspecified: Secondary | ICD-10-CM

## 2024-01-25 DIAGNOSIS — R21 Rash and other nonspecific skin eruption: Secondary | ICD-10-CM

## 2024-01-25 DIAGNOSIS — L509 Urticaria, unspecified: Secondary | ICD-10-CM

## 2024-01-25 MED ORDER — CLOBETASOL PROPIONATE 0.05 % EX SOLN: CUTANEOUS | 1 refills | Status: AC

## 2024-01-25 MED ORDER — HYDROCORTISONE 2.5 % EX CREA: TOPICAL_CREAM | CUTANEOUS | 1 refills | Status: AC

## 2024-01-25 NOTE — Progress Notes (Addendum)
   Follow Up Visit   Subjective  Alexis Snow is a 33 y.o. female who presents for the following: Rash that started 1 month ago on the left lateral breast, then spread to the right lateral breast and hips. Patient had Covid at the end of August. She got a new jacuzzi and after she got in, rash came up 3 days later. Positive stress at work. She went to urgent care and was put on oral Prednisone. Rash wasn't as angry, but didn't go away. She went back to urgent care, and was given TMC 0.1% ointment. Rash is worse with heat and touch, rash is burn/itch. She is taking Zyrtec daily. No history of eczema. No one else in house is itching. No recent travel, hasn't been in a  hotel.     The following portions of the chart were reviewed this encounter and updated as appropriate: medications, allergies, medical history  Review of Systems:  No other skin or systemic complaints except as noted in HPI or Assessment and Plan.  Objective  Well appearing patient in no apparent distress; mood and affect are within normal limits.  A focused examination was performed of the following areas: Trunk, extremities  Relevant exam findings are noted in the Assessment and Plan.    Assessment & Plan     RASH - POSSIBLE URTICARIA WITH SECONDARY DERMATITIS- COVID related  Exam: Fine light pink papules on the back, abdomen, chest, hips; mild dermatographism in office today. Pt is on Zyrtec.  Chronic and persistent condition with duration or expected duration over one year. Condition is bothersome/symptomatic for patient. Currently flared.    Treatment Plan: Increase Zyrtec 10 mg to twice daily.   May continue TMC 0.1% Ointment to spot treat areas twice daily, as needed for itch.   Start hydrocortisone 2.5% cream twice daily to rash in groin/skin fold areas.  Eczema Skin Care  Buy TWO 16oz jars of CeraVe moisturizing cream  [facility], [facility], [facility] (no prescription needed)  Costs about $15 per jar   Jar  #1: Use as a moisturizer as needed. Can be applied to any area of the body. Use twice daily to unaffected areas.  Jar #2: Pour one 50ml bottle of clobetasol 0.05% solution into jar, mix well. Label this jar to indicate the medication has been added. Use twice daily to affected areas. Do not apply to face, groin or underarms.  Moisturizer may burn or sting initially. Try for at least 4 weeks.   Sample of CeraVe Itch Relief Lotion.  If rash not improving on follow-up, briefly discussed starting Dupixent.   Return in about 4 weeks (around 02/22/2024) for f/u rash.  IAndrea Kerns, CMA, am acting as scribe for Rexene Rattler, MD .   Documentation: I have reviewed the above documentation for accuracy and completeness, and I agree with the above.  Rexene Rattler, MD

## 2024-01-25 NOTE — Patient Instructions (Addendum)
 Increase Zyrtec to twice daily.   Eczema Skin Care  Buy TWO 16oz jars of CeraVe moisturizing cream  [facility], [facility] (no prescription needed)  Costs about $15 per jar   Jar #1: Use as a moisturizer as needed. Can be applied to any area of the body. Use twice daily to unaffected areas.  Jar #2: Pour one 50ml bottle of clobetasol 0.05% solution into jar, mix well. Label this jar to indicate the medication has been added. Use twice daily to affected areas. Do not apply to face, groin or underarms.  Moisturizer may burn or sting initially. Try for at least 4 weeks.    Recommend OTC Gold Bond Rapid Relief Anti-Itch cream (pramoxine + menthol ), CeraVe Anti-itch cream or lotion (pramoxine), Sarna lotion (Original- menthol  + camphor or Sensitive- pramoxine) or Eucerin 12 hour Itch Relief lotion (menthol ) up to 3 times per day to areas on body that are itchy.   Due to recent changes in healthcare laws, you may see results of your pathology and/or laboratory studies on MyChart before the doctors have had a chance to review them. We understand that in some cases there may be results that are confusing or concerning to you. Please understand that not all results are received at the same time and often the doctors may need to interpret multiple results in order to provide you with the best plan of care or course of treatment. Therefore, we ask that you please give us  2 business days to thoroughly review all your results before contacting the office for clarification. Should we see a critical lab result, you will be contacted sooner.   If You Need Anything After Your Visit  If you have any questions or concerns for your doctor, please call our main line at [phone] and press option 4 to reach your doctor's medical assistant. If no one answers, please leave a voicemail as directed and we will return your call as soon as possible. Messages left after 4 pm will be answered the following business  day.   You may also send us  a message via MyChart. We typically respond to MyChart messages within 1-2 business days.  For prescription refills, please ask your pharmacy to contact our office. Our fax number is [phone].  If you have an urgent issue when the clinic is closed that cannot wait until the next business day, you can page your doctor at the number below.    Please note that while we do our best to be available for urgent issues outside of office hours, we are not available 24/7.   If you have an urgent issue and are unable to reach us , you may choose to seek medical care at your doctor's office, retail clinic, urgent care center, or emergency room.  If you have a medical emergency, please immediately call 911 or go to the emergency department.  Pager Numbers  - Dr. Hester: [phone]  - Dr. Jackquline: [phone]  - Dr. Claudene: [phone]   - Dr. Raymund: [phone]  In the event of inclement weather, please call our main line at [phone] for an update on the status of any delays or closures.  Dermatology Medication Tips: Please keep the boxes that topical medications come in in order to help keep track of the instructions about where and how to use these. Pharmacies typically print the medication instructions only on the boxes and not directly on the medication tubes.   If your medication is too expensive, please contact our office at  [phone] option 4 or send us  a message through MyChart.   We are unable to tell what your co-pay for medications will be in advance as this is different depending on your insurance coverage. However, we may be able to find a substitute medication at lower cost or fill out paperwork to get insurance to cover a needed medication.   If a prior authorization is required to get your medication covered by your insurance company, please allow us  1-2 business days to complete this process.  Drug prices often vary depending on where  the prescription is filled and some pharmacies may offer cheaper prices.  The website www.goodrx.com contains coupons for medications through different pharmacies. The prices here do not account for what the cost may be with help from insurance (it may be cheaper with your insurance), but the website can give you the price if you did not use any insurance.  - You can print the associated coupon and take it with your prescription to the pharmacy.  - You may also stop by our office during regular business hours and pick up a GoodRx coupon card.  - If you need your prescription sent electronically to a different pharmacy, notify our office through [facility] or by phone at [phone] option 4.     Si Usted Necesita Algo Despus de Su Visita  Tambin puede enviarnos un mensaje a travs de Clinical cytogeneticist. Por lo general respondemos a los mensajes de MyChart en el transcurso de 1 a 2 das hbiles.  Para renovar recetas, por favor pida a su farmacia que se ponga en contacto con nuestra oficina. Randi lakes de fax es [place] [phone].  Si tiene un asunto urgente cuando la clnica est cerrada y que no puede esperar hasta el siguiente da hbil, puede llamar/localizar a su doctor(a) al nmero que aparece a continuacin.   Por favor, tenga en cuenta que aunque hacemos todo lo posible para estar disponibles para asuntos urgentes fuera del horario de [place], no estamos disponibles las 24 horas del da, los 7 [address]  [address] de la [place].   Si tiene un problema urgente y no puede comunicarse con nosotros, puede optar por buscar atencin mdica  en el consultorio de su doctor(a), en una clnica privada, en un centro de atencin urgente o en una sala de emergencias.  Si tiene Engineer, drilling, por favor llame inmediatamente al 911 o vaya a la sala de emergencias.  Nmeros de bper  - Dr. Hester: [phone]  - Dra. Jackquline: [phone]  - Dr. Claudene: [phone]  - Dra. Kitts:  [phone]  En caso de inclemencias del [place], por favor llame a nuestra lnea principal al [phone] para una actualizacin sobre el estado de cualquier retraso o cierre.  Consejos para la medicacin en dermatologa: Por favor, guarde las cajas en las que vienen los medicamentos de uso tpico para ayudarle a seguir las instrucciones sobre dnde y cmo usarlos. Las farmacias generalmente imprimen las instrucciones del medicamento slo en las cajas y no directamente en los tubos del [place].   Si su medicamento es muy caro, por favor, pngase en contacto con landry rieger llamando al [phone] y presione la opcin 4 o envenos un mensaje a travs de Clinical cytogeneticist.   No podemos decirle cul ser su copago por los medicamentos por adelantado ya que esto es diferente dependiendo de la cobertura de su seguro. Sin embargo, es posible que podamos encontrar un medicamento sustituto a Audiological scientist un formulario para que el [place]  el medicamento que se considera necesario.   Si se requiere una autorizacin previa para que su compaa de seguros malta su medicamento, por favor permtanos de 1 a 2 das hbiles para completar este proceso.  Los precios de los medicamentos varan con frecuencia dependiendo del Environmental consultant de dnde se surte la receta y alguna farmacias pueden ofrecer precios ms baratos.  El sitio web www.goodrx.com tiene cupones para medicamentos de Health and safety inspector. Los precios aqu no tienen en cuenta lo que podra costar con la ayuda del seguro (puede ser ms barato con su seguro), pero el sitio web puede darle el precio si no utiliz Tourist information centre manager.  - Puede imprimir el cupn correspondiente y llevarlo con su receta a la farmacia.  - Tambin puede pasar por nuestra oficina durante el horario de atencin regular y Education officer, museum una tarjeta de cupones de GoodRx.  - Si necesita que su receta se enve electrnicamente a una farmacia diferente, informe a nuestra oficina a travs de  MyChart de [place] o por telfono llamando al [phone] y presione la opcin 4.

## 2024-01-26 ENCOUNTER — Telehealth: Payer: Self-pay

## 2024-01-26 NOTE — Telephone Encounter (Signed)
 Patient contacted the office regarding taking Zyrtec twice daily. She is unable to tolerate 2 daily as it is making her feel loopy.  Patient asking for alternative recommendation or is it safe to take both at night?

## 2024-01-26 NOTE — Telephone Encounter (Signed)
Patient advised. aw

## 2024-01-31 NOTE — Addendum Note (Signed)
 Addended by: JACKQULINE SAWYER on: 01/31/2024 08:42 AM   Modules accepted: Level of Service

## 2024-02-24 ENCOUNTER — Ambulatory Visit: Admitting: Dermatology
